# Patient Record
Sex: Female | Born: 1935 | Race: Asian | Hispanic: No | Smoking: Former smoker
Health system: Southern US, Community
[De-identification: ages and names within clinical notes are randomized; demographics above are authoritative.]

## PROBLEM LIST (undated history)

## (undated) DIAGNOSIS — E785 Hyperlipidemia, unspecified: Secondary | ICD-10-CM

## (undated) DIAGNOSIS — K317 Polyp of stomach and duodenum: Secondary | ICD-10-CM

## (undated) DIAGNOSIS — I10 Essential (primary) hypertension: Secondary | ICD-10-CM

## (undated) DIAGNOSIS — N289 Disorder of kidney and ureter, unspecified: Secondary | ICD-10-CM

## (undated) DIAGNOSIS — M1990 Unspecified osteoarthritis, unspecified site: Secondary | ICD-10-CM

## (undated) HISTORY — DX: Hyperlipidemia, unspecified: E78.5

## (undated) HISTORY — DX: Polyp of stomach and duodenum: K31.7

## (undated) HISTORY — DX: Essential (primary) hypertension: I10

---

## 1962-11-04 HISTORY — PX: APPENDECTOMY: SHX54

## 1965-11-04 HISTORY — PX: BACK SURGERY: SHX140

## 2002-10-22 ENCOUNTER — Emergency Department (HOSPITAL_COMMUNITY): Admission: EM | Admit: 2002-10-22 | Discharge: 2002-10-23 | Payer: Self-pay | Admitting: Emergency Medicine

## 2002-11-04 HISTORY — PX: BACK SURGERY: SHX140

## 2002-11-24 ENCOUNTER — Inpatient Hospital Stay (HOSPITAL_COMMUNITY): Admission: EM | Admit: 2002-11-24 | Discharge: 2002-12-14 | Payer: Self-pay | Admitting: Unknown Physician Specialty

## 2002-11-24 ENCOUNTER — Encounter: Payer: Self-pay | Admitting: Neurology

## 2002-11-25 ENCOUNTER — Encounter: Payer: Self-pay | Admitting: Neurology

## 2002-11-26 ENCOUNTER — Encounter: Payer: Self-pay | Admitting: Neurology

## 2002-11-29 ENCOUNTER — Encounter: Payer: Self-pay | Admitting: Neurology

## 2002-12-08 ENCOUNTER — Encounter: Payer: Self-pay | Admitting: Neurology

## 2002-12-14 ENCOUNTER — Inpatient Hospital Stay (HOSPITAL_COMMUNITY)
Admission: RE | Admit: 2002-12-14 | Discharge: 2002-12-24 | Payer: Self-pay | Admitting: Physical Medicine & Rehabilitation

## 2003-01-17 ENCOUNTER — Encounter: Admission: RE | Admit: 2003-01-17 | Discharge: 2003-01-17 | Payer: Self-pay | Admitting: Neurosurgery

## 2003-01-17 ENCOUNTER — Encounter: Payer: Self-pay | Admitting: Neurosurgery

## 2003-02-16 ENCOUNTER — Encounter: Admission: RE | Admit: 2003-02-16 | Discharge: 2003-02-16 | Payer: Self-pay | Admitting: Family Medicine

## 2003-02-16 ENCOUNTER — Encounter: Payer: Self-pay | Admitting: Family Medicine

## 2003-03-09 ENCOUNTER — Encounter
Admission: RE | Admit: 2003-03-09 | Discharge: 2003-06-07 | Payer: Self-pay | Admitting: Physical Medicine & Rehabilitation

## 2003-04-28 ENCOUNTER — Encounter: Payer: Self-pay | Admitting: Neurosurgery

## 2003-04-28 ENCOUNTER — Encounter: Admission: RE | Admit: 2003-04-28 | Discharge: 2003-04-28 | Payer: Self-pay | Admitting: Neurosurgery

## 2003-05-05 ENCOUNTER — Encounter: Admission: RE | Admit: 2003-05-05 | Discharge: 2003-06-24 | Payer: Self-pay | Admitting: Neurosurgery

## 2003-06-28 ENCOUNTER — Encounter: Admission: RE | Admit: 2003-06-28 | Discharge: 2003-09-26 | Payer: Self-pay | Admitting: Orthopedic Surgery

## 2003-07-05 ENCOUNTER — Encounter
Admission: RE | Admit: 2003-07-05 | Discharge: 2003-10-03 | Payer: Self-pay | Admitting: Physical Medicine & Rehabilitation

## 2003-07-28 ENCOUNTER — Encounter: Admission: RE | Admit: 2003-07-28 | Discharge: 2003-10-05 | Payer: Self-pay | Admitting: Family Medicine

## 2004-11-28 ENCOUNTER — Inpatient Hospital Stay (HOSPITAL_COMMUNITY): Admission: EM | Admit: 2004-11-28 | Discharge: 2004-12-01 | Payer: Self-pay | Admitting: Emergency Medicine

## 2004-12-10 ENCOUNTER — Ambulatory Visit: Payer: Self-pay | Admitting: Family Medicine

## 2004-12-26 ENCOUNTER — Ambulatory Visit: Payer: Self-pay | Admitting: Family Medicine

## 2005-01-30 ENCOUNTER — Ambulatory Visit: Payer: Self-pay | Admitting: Family Medicine

## 2005-03-04 ENCOUNTER — Ambulatory Visit: Payer: Self-pay | Admitting: Family Medicine

## 2005-03-18 ENCOUNTER — Ambulatory Visit: Payer: Self-pay | Admitting: Family Medicine

## 2005-04-11 ENCOUNTER — Encounter: Admission: RE | Admit: 2005-04-11 | Discharge: 2005-07-10 | Payer: Self-pay | Admitting: Family Medicine

## 2006-01-13 ENCOUNTER — Ambulatory Visit: Payer: Self-pay | Admitting: Family Medicine

## 2006-01-20 ENCOUNTER — Ambulatory Visit: Payer: Self-pay | Admitting: Family Medicine

## 2006-02-24 ENCOUNTER — Ambulatory Visit: Payer: Self-pay | Admitting: Family Medicine

## 2006-03-06 ENCOUNTER — Ambulatory Visit: Payer: Self-pay | Admitting: Family Medicine

## 2006-05-05 ENCOUNTER — Ambulatory Visit: Payer: Self-pay | Admitting: Family Medicine

## 2006-05-05 ENCOUNTER — Encounter: Payer: Self-pay | Admitting: Family Medicine

## 2006-05-05 ENCOUNTER — Other Ambulatory Visit: Admission: RE | Admit: 2006-05-05 | Discharge: 2006-05-05 | Payer: Self-pay | Admitting: Family Medicine

## 2006-05-29 ENCOUNTER — Ambulatory Visit: Payer: Self-pay | Admitting: Gastroenterology

## 2006-06-18 ENCOUNTER — Encounter: Admission: RE | Admit: 2006-06-18 | Discharge: 2006-06-18 | Payer: Self-pay | Admitting: Family Medicine

## 2006-08-04 ENCOUNTER — Ambulatory Visit: Payer: Self-pay | Admitting: Family Medicine

## 2006-08-07 ENCOUNTER — Ambulatory Visit: Payer: Self-pay | Admitting: Family Medicine

## 2006-08-14 ENCOUNTER — Ambulatory Visit: Payer: Self-pay | Admitting: Family Medicine

## 2006-08-14 LAB — CONVERTED CEMR LAB
Alkaline Phosphatase: 56 units/L (ref 39–117)
Calcium: 10 mg/dL (ref 8.4–10.5)
Chloride: 102 meq/L (ref 96–112)
Creatinine,U: 41.1 mg/dL
Hgb A1c MFr Bld: 6.9 % — ABNORMAL HIGH (ref 4.6–6.0)
Microalb Creat Ratio: 146 mg/g — ABNORMAL HIGH (ref 0.0–30.0)
Microalb, Ur: 6 mg/dL — ABNORMAL HIGH (ref 0.0–1.9)
Potassium: 4.3 meq/L (ref 3.5–5.1)

## 2006-09-17 ENCOUNTER — Ambulatory Visit: Payer: Self-pay | Admitting: Gastroenterology

## 2006-10-16 ENCOUNTER — Encounter (INDEPENDENT_AMBULATORY_CARE_PROVIDER_SITE_OTHER): Payer: Self-pay | Admitting: *Deleted

## 2006-10-16 ENCOUNTER — Ambulatory Visit: Payer: Self-pay | Admitting: Gastroenterology

## 2006-11-06 ENCOUNTER — Encounter: Admission: RE | Admit: 2006-11-06 | Discharge: 2006-11-06 | Payer: Self-pay | Admitting: Nephrology

## 2006-11-10 ENCOUNTER — Ambulatory Visit: Payer: Self-pay | Admitting: Family Medicine

## 2006-11-10 ENCOUNTER — Encounter: Admission: RE | Admit: 2006-11-10 | Discharge: 2006-11-10 | Payer: Self-pay | Admitting: Family Medicine

## 2006-11-19 ENCOUNTER — Ambulatory Visit: Payer: Self-pay | Admitting: Family Medicine

## 2006-11-19 LAB — CONVERTED CEMR LAB
ALT: 14 units/L (ref 0–40)
AST: 25 units/L (ref 0–37)
BUN: 16 mg/dL (ref 6–23)
Calcium: 9.6 mg/dL (ref 8.4–10.5)
Chloride: 103 meq/L (ref 96–112)
Creatinine,U: 26 mg/dL
GFR calc non Af Amer: 47 mL/min
Glucose, Bld: 124 mg/dL — ABNORMAL HIGH (ref 70–99)
Microalb, Ur: 3.2 mg/dL — ABNORMAL HIGH (ref 0.0–1.9)
Potassium: 4.5 meq/L (ref 3.5–5.1)
Sodium: 138 meq/L (ref 135–145)

## 2006-11-25 ENCOUNTER — Ambulatory Visit: Payer: Self-pay | Admitting: Gastroenterology

## 2006-12-03 ENCOUNTER — Ambulatory Visit: Payer: Self-pay | Admitting: Family Medicine

## 2007-02-25 ENCOUNTER — Ambulatory Visit: Payer: Self-pay | Admitting: Family Medicine

## 2007-02-25 LAB — CONVERTED CEMR LAB
AST: 26 units/L (ref 0–37)
Alkaline Phosphatase: 55 units/L (ref 39–117)
Bilirubin, Direct: 0.1 mg/dL (ref 0.0–0.3)
Creatinine, Ser: 1.4 mg/dL — ABNORMAL HIGH (ref 0.4–1.2)
GFR calc Af Amer: 48 mL/min
GFR calc non Af Amer: 40 mL/min
Glucose, Bld: 123 mg/dL — ABNORMAL HIGH (ref 70–99)
Microalb Creat Ratio: 148.7 mg/g — ABNORMAL HIGH (ref 0.0–30.0)
Microalb, Ur: 4.7 mg/dL — ABNORMAL HIGH (ref 0.0–1.9)
Potassium: 4.3 meq/L (ref 3.5–5.1)
Total Protein: 7.1 g/dL (ref 6.0–8.3)

## 2007-03-11 ENCOUNTER — Ambulatory Visit: Payer: Self-pay | Admitting: Family Medicine

## 2007-03-19 DIAGNOSIS — Z860100 Personal history of colon polyps, unspecified: Secondary | ICD-10-CM | POA: Insufficient documentation

## 2007-03-19 DIAGNOSIS — J45909 Unspecified asthma, uncomplicated: Secondary | ICD-10-CM | POA: Insufficient documentation

## 2007-03-19 DIAGNOSIS — K922 Gastrointestinal hemorrhage, unspecified: Secondary | ICD-10-CM | POA: Insufficient documentation

## 2007-03-19 DIAGNOSIS — I10 Essential (primary) hypertension: Secondary | ICD-10-CM | POA: Insufficient documentation

## 2007-03-19 DIAGNOSIS — Z8739 Personal history of other diseases of the musculoskeletal system and connective tissue: Secondary | ICD-10-CM

## 2007-03-19 DIAGNOSIS — E1142 Type 2 diabetes mellitus with diabetic polyneuropathy: Secondary | ICD-10-CM | POA: Insufficient documentation

## 2007-06-10 ENCOUNTER — Ambulatory Visit: Payer: Self-pay | Admitting: Family Medicine

## 2007-06-16 ENCOUNTER — Encounter: Payer: Self-pay | Admitting: Family Medicine

## 2007-06-17 ENCOUNTER — Encounter: Payer: Self-pay | Admitting: Family Medicine

## 2007-06-19 ENCOUNTER — Telehealth (INDEPENDENT_AMBULATORY_CARE_PROVIDER_SITE_OTHER): Payer: Self-pay | Admitting: *Deleted

## 2007-06-24 ENCOUNTER — Ambulatory Visit: Payer: Self-pay | Admitting: Family Medicine

## 2007-06-24 DIAGNOSIS — J301 Allergic rhinitis due to pollen: Secondary | ICD-10-CM

## 2007-06-24 DIAGNOSIS — E785 Hyperlipidemia, unspecified: Secondary | ICD-10-CM | POA: Insufficient documentation

## 2007-06-25 ENCOUNTER — Encounter: Payer: Self-pay | Admitting: Family Medicine

## 2007-07-07 ENCOUNTER — Telehealth (INDEPENDENT_AMBULATORY_CARE_PROVIDER_SITE_OTHER): Payer: Self-pay | Admitting: *Deleted

## 2007-07-21 ENCOUNTER — Encounter: Payer: Self-pay | Admitting: Family Medicine

## 2007-08-09 ENCOUNTER — Inpatient Hospital Stay (HOSPITAL_COMMUNITY): Admission: EM | Admit: 2007-08-09 | Discharge: 2007-08-12 | Payer: Self-pay | Admitting: Emergency Medicine

## 2007-08-09 ENCOUNTER — Encounter: Payer: Self-pay | Admitting: Family Medicine

## 2007-08-10 ENCOUNTER — Ambulatory Visit: Payer: Self-pay | Admitting: Internal Medicine

## 2007-08-17 ENCOUNTER — Ambulatory Visit: Payer: Self-pay | Admitting: Internal Medicine

## 2007-08-18 ENCOUNTER — Encounter: Payer: Self-pay | Admitting: Family Medicine

## 2007-08-20 ENCOUNTER — Encounter: Payer: Self-pay | Admitting: Family Medicine

## 2007-09-07 ENCOUNTER — Telehealth (INDEPENDENT_AMBULATORY_CARE_PROVIDER_SITE_OTHER): Payer: Self-pay | Admitting: *Deleted

## 2007-09-11 ENCOUNTER — Ambulatory Visit: Payer: Self-pay | Admitting: Family Medicine

## 2007-09-11 DIAGNOSIS — M810 Age-related osteoporosis without current pathological fracture: Secondary | ICD-10-CM | POA: Insufficient documentation

## 2007-09-11 LAB — CONVERTED CEMR LAB: Hemoglobin: 13.2 g/dL

## 2007-09-15 ENCOUNTER — Telehealth (INDEPENDENT_AMBULATORY_CARE_PROVIDER_SITE_OTHER): Payer: Self-pay | Admitting: *Deleted

## 2007-09-15 ENCOUNTER — Encounter: Payer: Self-pay | Admitting: Family Medicine

## 2007-09-16 ENCOUNTER — Telehealth: Payer: Self-pay | Admitting: Family Medicine

## 2007-09-24 ENCOUNTER — Telehealth: Payer: Self-pay | Admitting: Family Medicine

## 2007-09-25 ENCOUNTER — Ambulatory Visit: Payer: Self-pay | Admitting: Family Medicine

## 2007-09-28 ENCOUNTER — Encounter (INDEPENDENT_AMBULATORY_CARE_PROVIDER_SITE_OTHER): Payer: Self-pay | Admitting: *Deleted

## 2007-10-06 ENCOUNTER — Telehealth (INDEPENDENT_AMBULATORY_CARE_PROVIDER_SITE_OTHER): Payer: Self-pay | Admitting: *Deleted

## 2007-10-09 ENCOUNTER — Ambulatory Visit: Payer: Self-pay | Admitting: Family Medicine

## 2007-10-10 ENCOUNTER — Encounter: Payer: Self-pay | Admitting: Family Medicine

## 2007-10-13 ENCOUNTER — Telehealth (INDEPENDENT_AMBULATORY_CARE_PROVIDER_SITE_OTHER): Payer: Self-pay | Admitting: *Deleted

## 2007-10-14 ENCOUNTER — Encounter: Payer: Self-pay | Admitting: Family Medicine

## 2007-10-15 ENCOUNTER — Telehealth (INDEPENDENT_AMBULATORY_CARE_PROVIDER_SITE_OTHER): Payer: Self-pay | Admitting: *Deleted

## 2007-10-22 ENCOUNTER — Ambulatory Visit: Payer: Self-pay | Admitting: Cardiology

## 2007-11-11 ENCOUNTER — Telehealth (INDEPENDENT_AMBULATORY_CARE_PROVIDER_SITE_OTHER): Payer: Self-pay | Admitting: *Deleted

## 2007-12-07 ENCOUNTER — Telehealth (INDEPENDENT_AMBULATORY_CARE_PROVIDER_SITE_OTHER): Payer: Self-pay | Admitting: *Deleted

## 2007-12-09 ENCOUNTER — Telehealth (INDEPENDENT_AMBULATORY_CARE_PROVIDER_SITE_OTHER): Payer: Self-pay | Admitting: *Deleted

## 2007-12-17 ENCOUNTER — Ambulatory Visit: Payer: Self-pay | Admitting: Family Medicine

## 2007-12-21 ENCOUNTER — Ambulatory Visit: Payer: Self-pay | Admitting: Cardiovascular Disease

## 2007-12-22 ENCOUNTER — Telehealth (INDEPENDENT_AMBULATORY_CARE_PROVIDER_SITE_OTHER): Payer: Self-pay | Admitting: *Deleted

## 2008-01-06 ENCOUNTER — Telehealth: Payer: Self-pay | Admitting: Family Medicine

## 2008-01-06 LAB — CONVERTED CEMR LAB
ALT: 14 units/L (ref 0–35)
Bilirubin, Direct: 0.1 mg/dL (ref 0.0–0.3)
HDL: 47 mg/dL (ref >39.0)
Total Bilirubin: 0.7 mg/dL (ref 0.3–1.2)
Total CHOL/HDL Ratio: 5.4
Total Protein: 7.2 g/dL (ref 6.0–8.3)
Triglycerides: 162 mg/dL — ABNORMAL HIGH (ref 0–149)

## 2008-01-08 ENCOUNTER — Encounter: Payer: Self-pay | Admitting: Family Medicine

## 2008-01-13 ENCOUNTER — Encounter: Payer: Self-pay | Admitting: Family Medicine

## 2008-02-04 ENCOUNTER — Telehealth (INDEPENDENT_AMBULATORY_CARE_PROVIDER_SITE_OTHER): Payer: Self-pay | Admitting: *Deleted

## 2008-02-29 ENCOUNTER — Ambulatory Visit: Payer: Self-pay | Admitting: Family Medicine

## 2008-03-02 LAB — CONVERTED CEMR LAB: Alkaline Phosphatase: 45 units/L (ref 39–117)

## 2008-03-03 ENCOUNTER — Encounter (INDEPENDENT_AMBULATORY_CARE_PROVIDER_SITE_OTHER): Payer: Self-pay | Admitting: *Deleted

## 2008-03-03 ENCOUNTER — Telehealth (INDEPENDENT_AMBULATORY_CARE_PROVIDER_SITE_OTHER): Payer: Self-pay | Admitting: *Deleted

## 2008-03-04 ENCOUNTER — Telehealth (INDEPENDENT_AMBULATORY_CARE_PROVIDER_SITE_OTHER): Payer: Self-pay | Admitting: *Deleted

## 2008-03-07 ENCOUNTER — Ambulatory Visit: Payer: Self-pay | Admitting: Internal Medicine

## 2008-03-07 LAB — CONVERTED CEMR LAB
Cholesterol: 320 mg/dL (ref 0–200)
Total CHOL/HDL Ratio: 8.4
VLDL: 54 mg/dL — ABNORMAL HIGH (ref 0–40)

## 2008-06-09 ENCOUNTER — Telehealth (INDEPENDENT_AMBULATORY_CARE_PROVIDER_SITE_OTHER): Payer: Self-pay | Admitting: *Deleted

## 2008-06-16 ENCOUNTER — Ambulatory Visit: Payer: Self-pay | Admitting: Family Medicine

## 2008-06-20 ENCOUNTER — Ambulatory Visit: Payer: Self-pay | Admitting: Cardiology

## 2008-06-23 ENCOUNTER — Encounter: Payer: Self-pay | Admitting: Family Medicine

## 2008-06-23 ENCOUNTER — Telehealth (INDEPENDENT_AMBULATORY_CARE_PROVIDER_SITE_OTHER): Payer: Self-pay | Admitting: *Deleted

## 2008-06-23 LAB — CONVERTED CEMR LAB
ALT: 17 units/L (ref 0–35)
Albumin: 4 g/dL (ref 3.5–5.2)
Alkaline Phosphatase: 37 units/L — ABNORMAL LOW (ref 39–117)
Direct LDL: 159.6 mg/dL
Total Protein: 7.8 g/dL (ref 6.0–8.3)
Triglycerides: 223 mg/dL (ref 0–149)

## 2008-07-08 ENCOUNTER — Telehealth (INDEPENDENT_AMBULATORY_CARE_PROVIDER_SITE_OTHER): Payer: Self-pay | Admitting: *Deleted

## 2008-09-14 ENCOUNTER — Ambulatory Visit: Payer: Self-pay | Admitting: Family Medicine

## 2008-09-19 ENCOUNTER — Ambulatory Visit: Payer: Self-pay | Admitting: Cardiology

## 2008-10-05 ENCOUNTER — Telehealth (INDEPENDENT_AMBULATORY_CARE_PROVIDER_SITE_OTHER): Payer: Self-pay | Admitting: *Deleted

## 2008-10-06 LAB — CONVERTED CEMR LAB
ALT: 16 units/L (ref 0–35)
AST: 27 units/L (ref 0–37)
Albumin: 3.7 g/dL (ref 3.5–5.2)
Direct LDL: 169.9 mg/dL
Total CHOL/HDL Ratio: 7
Total Protein: 7.3 g/dL (ref 6.0–8.3)
VLDL: 38 mg/dL (ref 0–40)

## 2008-12-06 ENCOUNTER — Telehealth (INDEPENDENT_AMBULATORY_CARE_PROVIDER_SITE_OTHER): Payer: Self-pay | Admitting: *Deleted

## 2009-01-05 ENCOUNTER — Telehealth (INDEPENDENT_AMBULATORY_CARE_PROVIDER_SITE_OTHER): Payer: Self-pay | Admitting: *Deleted

## 2009-01-11 ENCOUNTER — Ambulatory Visit: Payer: Self-pay | Admitting: Internal Medicine

## 2009-01-11 LAB — CONVERTED CEMR LAB
AST: 28 units/L (ref 0–37)
Alkaline Phosphatase: 42 units/L (ref 39–117)
Bilirubin, Direct: 0.1 mg/dL (ref 0.0–0.3)
Total CHOL/HDL Ratio: 4.6
Total Protein: 7.7 g/dL (ref 6.0–8.3)
Triglycerides: 191 mg/dL — ABNORMAL HIGH (ref 0–149)
VLDL: 38 mg/dL (ref 0–40)

## 2009-01-18 ENCOUNTER — Ambulatory Visit: Payer: Self-pay | Admitting: Family Medicine

## 2009-01-20 ENCOUNTER — Encounter (INDEPENDENT_AMBULATORY_CARE_PROVIDER_SITE_OTHER): Payer: Self-pay | Admitting: *Deleted

## 2009-01-20 LAB — CONVERTED CEMR LAB: Vit D, 25-Hydroxy: 37 ng/mL (ref 30–89)

## 2009-01-23 ENCOUNTER — Encounter: Admission: RE | Admit: 2009-01-23 | Discharge: 2009-01-23 | Payer: Self-pay | Admitting: Family Medicine

## 2009-01-23 ENCOUNTER — Encounter: Payer: Self-pay | Admitting: Family Medicine

## 2009-01-30 ENCOUNTER — Ambulatory Visit: Payer: Self-pay | Admitting: Cardiology

## 2009-01-30 ENCOUNTER — Encounter (INDEPENDENT_AMBULATORY_CARE_PROVIDER_SITE_OTHER): Payer: Self-pay | Admitting: *Deleted

## 2009-01-30 ENCOUNTER — Encounter: Payer: Self-pay | Admitting: Cardiology

## 2009-02-08 ENCOUNTER — Encounter: Payer: Self-pay | Admitting: Family Medicine

## 2009-02-10 ENCOUNTER — Encounter (INDEPENDENT_AMBULATORY_CARE_PROVIDER_SITE_OTHER): Payer: Self-pay | Admitting: *Deleted

## 2009-02-10 LAB — CONVERTED CEMR LAB
ALT: 22 units/L (ref 0–35)
AST: 26 units/L (ref 0–37)
BUN: 27 mg/dL — ABNORMAL HIGH (ref 6–23)
Basophils Absolute: 0 10*3/uL (ref 0.0–0.1)
CO2: 32 meq/L (ref 19–32)
Calcium: 9.8 mg/dL (ref 8.4–10.5)
Eosinophils Absolute: 0.2 10*3/uL (ref 0.0–0.7)
GFR calc non Af Amer: 42.89 mL/min (ref >60)
Glucose, Bld: 123 mg/dL — ABNORMAL HIGH (ref 70–99)
HCT: 36.5 % (ref 36.0–46.0)
Hgb A1c MFr Bld: 6.7 % — ABNORMAL HIGH (ref 4.6–6.5)
Lymphocytes Relative: 25.4 % (ref 12.0–46.0)
Lymphs Abs: 2.1 10*3/uL (ref 0.7–4.0)
MCHC: 34.7 g/dL (ref 30.0–36.0)
Microalb Creat Ratio: 266.3 mg/g — ABNORMAL HIGH (ref 0.0–30.0)
Neutro Abs: 5.5 10*3/uL (ref 1.4–7.7)
Neutrophils Relative %: 65.3 % (ref 43.0–77.0)
Potassium: 4.3 meq/L (ref 3.5–5.1)
RDW: 13.1 % (ref 11.5–14.6)
Total Bilirubin: 0.9 mg/dL (ref 0.3–1.2)

## 2009-02-17 ENCOUNTER — Telehealth (INDEPENDENT_AMBULATORY_CARE_PROVIDER_SITE_OTHER): Payer: Self-pay | Admitting: *Deleted

## 2009-04-24 ENCOUNTER — Telehealth (INDEPENDENT_AMBULATORY_CARE_PROVIDER_SITE_OTHER): Payer: Self-pay | Admitting: *Deleted

## 2009-05-03 ENCOUNTER — Encounter: Payer: Self-pay | Admitting: Cardiology

## 2009-05-10 ENCOUNTER — Ambulatory Visit: Payer: Self-pay | Admitting: Family Medicine

## 2009-05-17 ENCOUNTER — Ambulatory Visit: Payer: Self-pay | Admitting: Internal Medicine

## 2009-05-22 LAB — CONVERTED CEMR LAB
Alkaline Phosphatase: 50 units/L (ref 39–117)
Bilirubin, Direct: 0.2 mg/dL (ref 0.0–0.3)
Cholesterol: 233 mg/dL — ABNORMAL HIGH (ref 0–200)
HDL: 50.9 mg/dL (ref >39.00)
VLDL: 41.2 mg/dL — ABNORMAL HIGH (ref 0.0–40.0)

## 2009-05-25 ENCOUNTER — Ambulatory Visit: Payer: Self-pay | Admitting: Family Medicine

## 2009-06-04 LAB — CONVERTED CEMR LAB
Albumin: 3.9 g/dL (ref 3.5–5.2)
BUN: 23 mg/dL (ref 6–23)
Basophils Absolute: 0.1 10*3/uL (ref 0.0–0.1)
Basophils Relative: 0.8 % (ref 0.0–3.0)
CO2: 30 meq/L (ref 19–32)
Creatinine, Ser: 1.4 mg/dL — ABNORMAL HIGH (ref 0.4–1.2)
Direct LDL: 138 mg/dL
Eosinophils Absolute: 0.2 10*3/uL (ref 0.0–0.7)
GFR calc non Af Amer: 39.33 mL/min (ref >60)
HDL: 43.3 mg/dL (ref >39.00)
Hemoglobin: 12.4 g/dL (ref 12.0–15.0)
Hgb A1c MFr Bld: 6.6 % — ABNORMAL HIGH (ref 4.6–6.5)
Lymphocytes Relative: 25.6 % (ref 12.0–46.0)
Lymphs Abs: 2 10*3/uL (ref 0.7–4.0)
Microalb Creat Ratio: 231.6 mg/g — ABNORMAL HIGH (ref 0.0–30.0)
Microalb, Ur: 21.1 mg/dL — ABNORMAL HIGH (ref 0.0–1.9)
Monocytes Relative: 8.9 % (ref 3.0–12.0)
Platelets: 256 10*3/uL (ref 150.0–400.0)
RBC: 3.93 M/uL (ref 3.87–5.11)
Total Protein: 7.5 g/dL (ref 6.0–8.3)

## 2009-06-06 ENCOUNTER — Encounter (INDEPENDENT_AMBULATORY_CARE_PROVIDER_SITE_OTHER): Payer: Self-pay | Admitting: *Deleted

## 2009-06-07 ENCOUNTER — Ambulatory Visit: Payer: Self-pay | Admitting: Family Medicine

## 2009-06-07 DIAGNOSIS — H269 Unspecified cataract: Secondary | ICD-10-CM

## 2009-06-07 DIAGNOSIS — K589 Irritable bowel syndrome without diarrhea: Secondary | ICD-10-CM

## 2009-06-07 DIAGNOSIS — R9431 Abnormal electrocardiogram [ECG] [EKG]: Secondary | ICD-10-CM

## 2009-06-28 ENCOUNTER — Encounter: Payer: Self-pay | Admitting: Family Medicine

## 2009-07-07 ENCOUNTER — Ambulatory Visit: Payer: Self-pay | Admitting: Cardiology

## 2009-07-17 ENCOUNTER — Ambulatory Visit: Payer: Self-pay | Admitting: Gastroenterology

## 2009-07-17 DIAGNOSIS — R197 Diarrhea, unspecified: Secondary | ICD-10-CM

## 2009-07-21 ENCOUNTER — Encounter: Payer: Self-pay | Admitting: Gastroenterology

## 2009-07-21 ENCOUNTER — Telehealth: Payer: Self-pay | Admitting: Gastroenterology

## 2009-08-09 ENCOUNTER — Ambulatory Visit: Payer: Self-pay | Admitting: Family Medicine

## 2009-08-15 LAB — CONVERTED CEMR LAB
Alkaline Phosphatase: 39 units/L (ref 39–117)
Cholesterol: 228 mg/dL — ABNORMAL HIGH (ref 0–200)
Direct LDL: 139.2 mg/dL
HDL: 42.7 mg/dL (ref >39.00)
Total Protein: 7.6 g/dL (ref 6.0–8.3)

## 2009-08-17 ENCOUNTER — Ambulatory Visit: Payer: Self-pay | Admitting: Cardiology

## 2009-11-22 ENCOUNTER — Ambulatory Visit: Payer: Self-pay | Admitting: Family Medicine

## 2009-11-27 ENCOUNTER — Ambulatory Visit: Payer: Self-pay | Admitting: Cardiology

## 2009-12-02 LAB — CONVERTED CEMR LAB
AST: 22 units/L (ref 0–37)
Alkaline Phosphatase: 45 units/L (ref 39–117)
Direct LDL: 133.7 mg/dL
HDL: 50.9 mg/dL (ref >39.00)
Total Bilirubin: 1 mg/dL (ref 0.3–1.2)
Total CHOL/HDL Ratio: 4
VLDL: 44.6 mg/dL — ABNORMAL HIGH (ref 0.0–40.0)

## 2009-12-15 ENCOUNTER — Ambulatory Visit: Payer: Self-pay | Admitting: Family Medicine

## 2009-12-19 ENCOUNTER — Telehealth (INDEPENDENT_AMBULATORY_CARE_PROVIDER_SITE_OTHER): Payer: Self-pay | Admitting: *Deleted

## 2009-12-19 LAB — CONVERTED CEMR LAB
BUN: 22 mg/dL (ref 6–23)
Calcium: 10 mg/dL (ref 8.4–10.5)
GFR calc non Af Amer: 46.92 mL/min (ref >60)
Glucose, Bld: 102 mg/dL — ABNORMAL HIGH (ref 70–99)
Hgb A1c MFr Bld: 7 % — ABNORMAL HIGH (ref 4.6–6.5)
Potassium: 4.5 meq/L (ref 3.5–5.1)

## 2009-12-26 ENCOUNTER — Encounter: Payer: Self-pay | Admitting: Family Medicine

## 2009-12-27 ENCOUNTER — Ambulatory Visit: Payer: Self-pay | Admitting: Family Medicine

## 2009-12-28 ENCOUNTER — Encounter: Payer: Self-pay | Admitting: Family Medicine

## 2010-03-08 ENCOUNTER — Telehealth (INDEPENDENT_AMBULATORY_CARE_PROVIDER_SITE_OTHER): Payer: Self-pay | Admitting: *Deleted

## 2010-06-04 ENCOUNTER — Encounter: Payer: Self-pay | Admitting: Family Medicine

## 2010-07-30 ENCOUNTER — Telehealth: Payer: Self-pay | Admitting: Family Medicine

## 2010-08-30 ENCOUNTER — Telehealth: Payer: Self-pay | Admitting: Family Medicine

## 2010-09-26 ENCOUNTER — Telehealth (INDEPENDENT_AMBULATORY_CARE_PROVIDER_SITE_OTHER): Payer: Self-pay | Admitting: *Deleted

## 2010-12-02 LAB — CONVERTED CEMR LAB
ALT: 16 units/L (ref 0–35)
AST: 23 units/L (ref 0–37)
Albumin: 3.5 g/dL (ref 3.5–5.2)
BUN: 33 mg/dL — ABNORMAL HIGH (ref 6–23)
Bilirubin, Direct: 0.1 mg/dL (ref 0.0–0.3)
CO2: 29 meq/L (ref 19–32)
Calcium: 10 mg/dL (ref 8.4–10.5)
Creatinine, Ser: 1.5 mg/dL — ABNORMAL HIGH (ref 0.4–1.2)
Creatinine,U: 65.7 mg/dL
GFR calc Af Amer: 44 mL/min
GFR calc non Af Amer: 37 mL/min
Glucose, Bld: 101 mg/dL — ABNORMAL HIGH (ref 70–99)
Hemoglobin: 12.8 g/dL (ref 12.0–15.0)
Hgb A1c MFr Bld: 5.6 % (ref 4.6–6.0)
Hgb A1c MFr Bld: 5.9 % (ref 4.6–6.0)
MCHC: 35.2 g/dL (ref 30.0–36.0)
MCV: 89.7 fL (ref 78.0–100.0)
Microalb, Ur: 3.8 mg/dL — ABNORMAL HIGH (ref 0.0–1.9)
Neutrophils Relative %: 60.5 % (ref 43.0–77.0)
RBC: 4.05 M/uL (ref 3.87–5.11)
RDW: 13.8 % (ref 11.5–14.6)
Sodium: 139 meq/L (ref 135–145)

## 2010-12-04 NOTE — Progress Notes (Signed)
Summary: Swollen Fingers  Phone Note Call from Patient Call back at Work Phone [phone]   Caller: Patient Call For: Loreen Freud DO Summary of Call: Patient Index and Middle finger on right hand is swollen.  Patient has tried Ibuprofen and cold packets with little improvement.  Please call patient to let her know what else she can try at home. Initial call taken by: Barnie Mort,  Mar 08, 2010 3:58 PM  Follow-up for Phone Call        elevation, ice and ibuprofen is about all she can do at home.  Any pain? ---- Ov here or if pain go to ER Follow-up by: Loreen Freud DO,  Mar 08, 2010 4:12 PM  Additional Follow-up for Phone Call Additional follow up Details #1::        pt informed, she does not have pain now; she is taking Ibuprofen.  pt given Dr Laury Axon recommendations, offered appt pt wants to old off for now. No insurance .Kandice Hams  Mar 08, 2010 4:36 PM  Additional Follow-up by: Kandice Hams,  Mar 08, 2010 4:36 PM

## 2010-12-04 NOTE — Progress Notes (Signed)
Summary: Rx Request  Phone Note Refill Request Message from:  Fax from Pharmacy on July 30, 2010 10:15 AM  Refills Requested: Medication #1:  clotrimazole/betameth cream Rcv'd fax from pharmacy, Rx never filled here. Please advise  Initial call taken by: Almeta Monas CMA Duncan Dull),  July 30, 2010 10:15 AM  Follow-up for Phone Call        refill x1  2 refills Follow-up by: Loreen Freud DO,  July 30, 2010 10:44 AM    New/Updated Medications: CLOTRIMAZOLE-BETAMETHASONE 1-0.05 % CREA (CLOTRIMAZOLE-BETAMETHASONE) Apply as directed two times a day Prescriptions: CLOTRIMAZOLE-BETAMETHASONE 1-0.05 % CREA (CLOTRIMAZOLE-BETAMETHASONE) Apply as directed two times a day  #1 tube x 2   Entered by:   Jeremy Johann CMA   Authorized by:   Loreen Freud DO   Signed by:   Jeremy Johann CMA on 07/30/2010   Method used:   Faxed to ...       [facility]* (retail)       [address]       [place], [place]  [id number]       Ph: [phone]       Fax: [phone]   RxID:   [id number]

## 2010-12-04 NOTE — Medication Information (Signed)
Summary: Diabetes Supplies[facility]  Diabetes Supplies[facility]   Imported By: Lanelle Bal 06/11/2010 12:57:47  _____________________________________________________________________  External Attachment:    Type:   Image     Comment:   External Document

## 2010-12-04 NOTE — Progress Notes (Signed)
Summary: refill  Phone Note Refill Request Message from:  Fax from Pharmacy on August 30, 2010 8:32 AM  Refills Requested: Medication #1:  BENICAR 20 MG  TABS 1 by mouth once daily [place] - fax [phone]  Initial call taken by: Okey Regal [place],  August 30, 2010 8:33 AM    Prescriptions: BENICAR 20 MG  TABS (OLMESARTAN MEDOXOMIL) 1 by mouth once daily  #30 Each x 1   Entered by:   Almeta Monas CMA (AAMA)   Authorized by:   Loreen Freud DO   Signed by:   Almeta Monas CMA (AAMA) on 08/30/2010   Method used:   Electronically to        [facility]* (retail)       [address]       [place], [place]  [id number]       Ph: [phone]       Fax: [phone]   RxID:   [id number]

## 2010-12-04 NOTE — Letter (Signed)
Summary: CMN for Diabetes Supplies/[facility]  CMN for Diabetes Supplies[facility]   Imported By: Lanelle Bal 01/01/2010 09:15:07  _____________________________________________________________________  External Attachment:    Type:   Image     Comment:   External Document

## 2010-12-04 NOTE — Progress Notes (Signed)
Summary: Refill Request  Phone Note Refill Request Call back at [phone] Message from:  Pharmacy on September 26, 2010 3:47 PM  Refills Requested: Medication #1:  TRAMADOL HCL 50 MG TABS 1 by mouth three times a day as needed   Dosage confirmed as above?Dosage Confirmed   Supply Requested: 1 month   Last Refilled: 07/26/2010 [facility]  Next Appointment Scheduled: none Initial call taken by: Harold Barban,  September 26, 2010 3:47 PM  Follow-up for Phone Call        last seen and filled 12/27/09.Marland KitchenMarland Kitchenplease advise Follow-up by: Almeta Monas CMA Duncan Dull),  September 26, 2010 3:52 PM

## 2010-12-04 NOTE — Progress Notes (Signed)
Summary: left msg for pt to call re; labs  Phone Note Outgoing Call   Call placed by: Kandice Hams,  December 19, 2009 11:29 AM Call placed to: Patient Summary of Call: left msg for  pt to call re; labs Initial call taken by: Kandice Hams,  December 19, 2009 11:29 AM  Follow-up for Phone Call        Your hgba1c should be <_7____.  Watch your simple sugars (cakes, cookies) and starches, white flour etc.   We will recheck fasting labs in ___3_ months.  Diet and exercise are important for this.  ---Pt is on glyburide for DM---  has she tried glucophage?  I don't see it on med list---if no--glucophage xr 500 1 by mouth qpm #30  2 refills.      hgba1c, bmp 250.00 Signed by Loreen Freud DO on 12/17/2009 at 4:10 PM Follow-up by: Army Fossa CMA,  December 19, 2009 11:45 AM  Additional Follow-up for Phone Call Additional follow up Details #1::        LMTCB. Army Fossa CMA  December 20, 2009 2:09 PM     Additional Follow-up for Phone Call Additional follow up Details #2::    LMTCB. Army Fossa CMA  December 26, 2009 11:42 AM   Additional Follow-up for Phone Call Additional follow up Details #3:: Details for Additional Follow-up Action Taken: Pt has not tried Glucophage before- she has an appt with Dr. Laury Axon tomorrow a.m. she would like for me to wait on sending in any medications until she is able to discuss this with Dr.Lowne. Army Fossa CMA  December 26, 2009 3:32 PM

## 2010-12-04 NOTE — Assessment & Plan Note (Signed)
Summary: RTO 6 MONTHS.CBS   Vital Signs:  Patient profile:   76 year old female Weight:      152 pounds Temp:     97.8 degrees F oral Pulse rate:   72 / minute Pulse rhythm:   regular BP sitting:   122 / 80  (left arm) Cuff size:   regular  Vitals Entered By: Army Fossa CMA (December 27, 2009 11:07 AM) CC: Pt here for 6 month follow up- states her right hand has been going numb. Discuss meds.    History of Present Illness:  Hypertension follow-up      This is a 76 year old woman who presents for Hypertension follow-up.  The patient denies lightheadedness, urinary frequency, headaches, edema, impotence, rash, and fatigue.  The patient denies the following associated symptoms: chest pain, chest pressure, exercise intolerance, dyspnea, palpitations, syncope, leg edema, and pedal edema.  Compliance with medications (by patient report) has been near 100%.  The patient reports that dietary compliance has been good.  The patient reports no exercise.  Adjunctive measures currently used by the patient include salt restriction.    Type 1 diabetes mellitus follow-up      The patient is also here for Type 2 diabetes mellitus follow-up.  The patient denies polyuria, polydipsia, blurred vision, self managed hypoglycemia, hypoglycemia requiring help, weight loss, weight gain, and numbness of extremities.  The patient denies the following symptoms: neuropathic pain, chest pain, vomiting, orthostatic symptoms, poor wound healing, intermittent claudication, vision loss, and foot ulcer.  Since the last visit the patient reports good dietary compliance, compliance with medications, and monitoring blood glucose.  Since the last visit, the patient reports having had eye care by an ophthalmologist.    Current Medications (verified): 1)  Tramadol Hcl 50 Mg Tabs (Tramadol Hcl) .Marland Kitchen.. 1 By Mouth Three Times A Day As Needed 2)  Benicar 20 Mg  Tabs (Olmesartan Medoxomil) .Marland Kitchen.. 1 By Mouth Once Daily 3)  Atenolol  100 Mg Tabs (Atenolol) .Marland Kitchen.. 1 By Mouth Once Daily 4)  Indapamide 1.25 Mg  Tabs (Indapamide) .Marland Kitchen.. 1 By Mouth Once Daily 5)  Glimepiride 2 Mg  Tabs (Glimepiride) .... Take One Tablet Daily 6)  Advair Diskus 250-50 Mcg/dose  Misc (Fluticasone-Salmeterol) .... 2 Puff Once Daily 7)  Astelin 137 Mcg/spray  Soln (Azelastine Hcl) .... 2 Sprays Each Nostril Two Times A Day 8)  Multivitamins   Tabs (Multiple Vitamin) .... Take 1 Tablet By Mouth Daily 9)  Ascorbic Acid 500 Mg Tabs (Ascorbic Acid) .Marland Kitchen.. 1 By Mouth Daily. 10)  Vitamin E 400 Unit Caps (Vitamin E) .Marland Kitchen.. 1 By Mouth Daily. 11)  Calcium 600 1500 Mg Tabs (Calcium Carbonate) .Marland Kitchen.. 1 By Mouth Daily. 12)  Fish Oil .... Take By Mouth As Directed 13)  Flax   Oil (Flaxseed (Linseed)) .... Take By Mouth As Directed 14)  Fenofibrate 54 Mg Tabs (Fenofibrate) .Marland Kitchen.. 1 By Mouth Daily 15)  Proair Hfa 108 (90 Base) Mcg/act Aers (Albuterol Sulfate) .... 2 Puffs Qid As Needed 16)  Vitamin D 1000 Unit Tabs (Cholecalciferol) .... Once Daily 17)  Hyomax-Sl 0.125 Mg Subl (Hyoscyamine Sulfate) .... Take 2 Tabs Sublingual Before Each Meal If The Vegetables Are Eaten 18)  Vytorin 10-20 Mg Tabs (Ezetimibe-Simvastatin) .... Take One Tablet By Mouth Every Other Day At Bedtime  Allergies: 1)  ! Pcn 2)  ! Codeine 3)  ! Ace Inhibitors  Past History:  Past medical, surgical, family and social histories (including risk factors) reviewed for relevance to  current acute and chronic problems.  Past Medical History: Reviewed history from 07/14/2009 and no changes required. Asthma Hypertension x 7 years Diabetes mellitus, type II x 5 years Breast cancer, hx of Hyperlipidemia x 15 years Osteoporosis hyperplastic polyps  2007  Past Surgical History: Reviewed history from 07/14/2009 and no changes required. Caesarean section 2  Appendectomy 1964  Family History: Reviewed history from 11/27/2009 and no changes required. Family History Hypertension No early heart  disease (father with CABG in late 53s) No FH of Colon Cancer:  Social History: Reviewed history from 07/07/2009 and no changes required. Former Smoker 1 1/2 ppd for 40 years. Quit 11 years ago Alcohol use-no Drug use-no Regular exercise-yes Divorced  Review of Systems      See HPI  Physical Exam  General:  Well-developed,well-nourished,in no acute distress; alert,appropriate and cooperative throughout examination Lungs:  Normal respiratory effort, chest expands symmetrically. Lungs are clear to auscultation, no crackles or wheezes. Heart:  normal rate and no murmur.    Diabetes Management Exam:    Foot Exam (with socks and/or shoes not present):       Sensory-Pinprick/Light touch:          Left medial foot (L-4): normal          Left dorsal foot (L-5): normal          Left lateral foot (S-1): normal          Right medial foot (L-4): normal          Right dorsal foot (L-5): normal          Right lateral foot (S-1): normal       Sensory-Monofilament:          Left foot: normal          Right foot: normal       Inspection:          Left foot: normal          Right foot: normal       Nails:          Left foot: normal          Right foot: normal    Eye Exam:       Eye Exam done elsewhere          Results: normal   Impression & Recommendations:  Problem # 1:  DIABETES MELLITUS, TYPE II (ICD-250.00)  pt will go back to 1/2 tab glimepiride two times a day and recheck labs in May Her updated medication list for this problem includes:    Benicar 20 Mg Tabs (Olmesartan medoxomil) .Marland Kitchen... 1 by mouth once daily    Glimepiride 2 Mg Tabs (Glimepiride) .Marland Kitchen... Take one tablet daily  Labs Reviewed: Creat: 1.2 (12/15/2009)     Last Eye Exam: diabetic retinopathy (04/12/2009) Reviewed HgBA1c results: 7.0 (12/15/2009)  6.6 (05/25/2009)  Orders: Prescription Created Electronically [phone])  Problem # 2:  HYPERTENSION (ICD-401.9)  Her updated medication list for this problem  includes:    Benicar 20 Mg Tabs (Olmesartan medoxomil) .Marland Kitchen... 1 by mouth once daily    Atenolol 100 Mg Tabs (Atenolol) .Marland Kitchen... 1 by mouth once daily    Indapamide 1.25 Mg Tabs (Indapamide) .Marland Kitchen... 1 by mouth once daily  BP today: 122/80 Prior BP: 130/74 (11/27/2009)  Labs Reviewed: K+: 4.5 (12/15/2009) Creat: : 1.2 (12/15/2009)   Chol: 216 (11/22/2009)   HDL: 50.90 (11/22/2009)   LDL: DEL (01/11/2009)   TG: 223.0 (11/22/2009)  Orders: Prescription Created Electronically (  [email or web address])  Problem # 3:  HYPERLIPIDEMIA (ICD-272.4) per lipid clinic Her updated medication list for this problem includes:    Fenofibrate 54 Mg Tabs (Fenofibrate) .Marland Kitchen... 1 by mouth daily    Vytorin 10-20 Mg Tabs (Ezetimibe-simvastatin) .Marland Kitchen... Take one tablet by mouth every other day at bedtime  Complete Medication List: 1)  Tramadol Hcl 50 Mg Tabs (Tramadol hcl) .Marland Kitchen.. 1 by mouth three times a day as needed 2)  Benicar 20 Mg Tabs (Olmesartan medoxomil) .Marland Kitchen.. 1 by mouth once daily 3)  Atenolol 100 Mg Tabs (Atenolol) .Marland Kitchen.. 1 by mouth once daily 4)  Indapamide 1.25 Mg Tabs (Indapamide) .Marland Kitchen.. 1 by mouth once daily 5)  Glimepiride 2 Mg Tabs (Glimepiride) .... Take one tablet daily 6)  Advair Diskus 250-50 Mcg/dose Misc (Fluticasone-salmeterol) .... 2 puff once daily 7)  Astelin 137 Mcg/spray Soln (Azelastine hcl) .... 2 sprays each nostril two times a day 8)  Multivitamins Tabs (Multiple vitamin) .... Take 1 tablet by mouth daily 9)  Ascorbic Acid 500 Mg Tabs (Ascorbic acid) .Marland Kitchen.. 1 by mouth daily. 10)  Vitamin E 400 Unit Caps (Vitamin e) .Marland Kitchen.. 1 by mouth daily. 11)  Calcium 600 1500 Mg Tabs (Calcium carbonate) .Marland Kitchen.. 1 by mouth daily. 12)  Fish Oil  .... Take by mouth as directed 13)  Flax Oil (Flaxseed (linseed)) .... Take by mouth as directed 14)  Fenofibrate 54 Mg Tabs (Fenofibrate) .Marland Kitchen.. 1 by mouth daily 15)  Proair Hfa 108 (90 Base) Mcg/act Aers (Albuterol sulfate) .... 2 puffs qid as needed 16)  Vitamin D 1000 Unit Tabs  (Cholecalciferol) .... Once daily 17)  Hyomax-sl 0.125 Mg Subl (Hyoscyamine sulfate) .... Take 2 tabs sublingual before each meal if the vegetables are eaten 18)  Vytorin 10-20 Mg Tabs (Ezetimibe-simvastatin) .... Take one tablet by mouth every other day at bedtime  Other Orders: Flu Vaccine 51yrs + (09811) Administration Flu vaccine - MCR (B1478) Prescriptions: GLIMEPIRIDE 2 MG  TABS (GLIMEPIRIDE) take one tablet daily  #90 x 3   Entered and Authorized by:   Loreen Freud DO   Signed by:   Loreen Freud DO on 12/27/2009   Method used:   Electronically to        [facility] (retail)       [address]       [place], [place]  [address]       Ph: [phone]       Fax: [phone]   RxID:   [id number] BENICAR 20 MG  TABS (OLMESARTAN MEDOXOMIL) 1 by mouth once daily  #30 x 2   Entered and Authorized by:   Loreen Freud DO   Signed by:   Loreen Freud DO on 12/27/2009   Method used:   Electronically to        [facility]* (retail)       [address]       [place], [place]  [address]       Ph: [phone]       Fax: [phone]   RxID:   [id number] TRAMADOL HCL 50 MG TABS (TRAMADOL HCL) 1 by mouth three times a day as needed  #90 x 2   Entered and Authorized by:   Loreen Freud DO   Signed by:   Loreen Freud DO on 12/27/2009   Method used:   Electronically to        [facility]* (retail)  [address]       [place], [place]  [address]       Ph: [phone]       Fax: [phone]   RxID:   [id number]  Flu Vaccine Consent Questions     Do you have a history of severe allergic reactions to this vaccine? no    Any prior history of allergic reactions to egg and/or gelatin? no    Do you have a sensitivity to the preservative Thimersol? no    Do you have a past history of Guillan-Barre Syndrome? no    Do you currently have an acute febrile illness? no    Have you ever had a severe reaction to latex?  no    Vaccine information given and explained to patient? yes    Are you currently pregnant? no    Lot Number:[id number]   Exp Date:05/03/2010   Site Given  right Deltoid IM       [address]       [place], [place]  [address]       Ph: [phone]       Fax: [phone]   RxID:   [id number]  .lbmedflu

## 2010-12-04 NOTE — Letter (Signed)
Summary: Revised CMN for Diabetes Supplies/[facility]  Revised CMN for Diabetes Supplies/[facility]   Imported By: Lanelle Bal 01/02/2010 10:12:00  _____________________________________________________________________  External Attachment:    Type:   Image     Comment:   External Document

## 2010-12-04 NOTE — Assessment & Plan Note (Signed)
Summary: rov lipid - [facility]   [name] is seen back in the lipid clinic.  She has not tolerated vytorin 10/40 every 3 days since her last visit. She says the day after the dose she has left arm pain limiting movement.  This goes away the next day.  She did not have this pain with the Vytorin 10/20mg  every 3 days.  She continues to follow a relatively low-cholesterol, low-fat diet.  Her exercise is limited by her leg/back discomfort.  She walks with a walker.  She states that she has had no significant changes in her health status, exercise, or diet therapies.    Lipid Management Provider  Shelby Dubin, PharmD, BCPS, CPP  Current Medications (verified): 1)  Tramadol Hcl 50 Mg Tabs (Tramadol Hcl) .Marland Kitchen.. 1-3 X Once Daily As Needed 2)  Benicar 20 Mg  Tabs (Olmesartan Medoxomil) .Marland Kitchen.. 1 By Mouth Once Daily 3)  Atenolol 100 Mg Tabs (Atenolol) .Marland Kitchen.. 1 By Mouth Once Daily 4)  Indapamide 1.25 Mg  Tabs (Indapamide) .Marland Kitchen.. 1 By Mouth Once Daily 5)  Glimepiride 2 Mg  Tabs (Glimepiride) .... Take One Tablet Daily 6)  Advair Diskus 250-50 Mcg/dose  Misc (Fluticasone-Salmeterol) .... 2 Puff Once Daily 7)  Astelin 137 Mcg/spray  Soln (Azelastine Hcl) .... 2 Sprays Each Nostril Two Times A Day 8)  Multivitamins   Tabs (Multiple Vitamin) .... Take 1 Tablet By Mouth Daily 9)  Ascorbic Acid 500 Mg Tabs (Ascorbic Acid) .Marland Kitchen.. 1 By Mouth Daily. 10)  Vitamin E 400 Unit Caps (Vitamin E) .Marland Kitchen.. 1 By Mouth Daily. 11)  Calcium 600 1500 Mg Tabs (Calcium Carbonate) .Marland Kitchen.. 1 By Mouth Daily. 12)  Fish Oil .... Take By Mouth As Directed 13)  Flax   Oil (Flaxseed (Linseed)) .... Take By Mouth As Directed 14)  Fenofibrate 54 Mg Tabs (Fenofibrate) .Marland Kitchen.. 1 By Mouth Daily 15)  Proair Hfa 108 (90 Base) Mcg/act Aers (Albuterol Sulfate) .... 2 Puffs Qid As Needed 16)  Vitamin D 1000 Unit Tabs (Cholecalciferol) .... Once Daily 17)  Hyomax-Sl 0.125 Mg Subl (Hyoscyamine Sulfate) .... Take 2 Tabs Sublingual Before Each Meal If The Vegetables Are  Eaten 18)  Vytorin 10-20 Mg Tabs (Ezetimibe-Simvastatin) .... Take One Tablet By Mouth Every Other Day At Bedtime  Allergies (verified): 1)  ! Pcn 2)  ! Codeine 3)  ! Ace Inhibitors  Past History:  Past Medical History: Last updated: 07/14/2009 Asthma Hypertension x 7 years Diabetes mellitus, type II x 5 years Breast cancer, hx of Hyperlipidemia x 15 years Osteoporosis hyperplastic polyps  2007  Past Surgical History: Last updated: 07/14/2009 Caesarean section 2  Appendectomy 1964  Family History: Last updated: 11/27/2009 Family History Hypertension No early heart disease (father with CABG in late 60s) No FH of Colon Cancer:  Social History: Last updated: 07/07/2009 Former Smoker 1 1/2 ppd for 40 years. Quit 11 years ago Alcohol use-no Drug use-no Regular exercise-yes Divorced  Risk Factors: Alcohol Use: 0 (06/07/2009) Caffeine Use: <1  (06/07/2009) Exercise: yes (06/07/2009)  Risk Factors: Smoking Status: quit (06/07/2009) Packs/Day: 1.5 (06/07/2009)  Family History: Reviewed history from 07/17/2009 and no changes required. Family History Hypertension No early heart disease (father with CABG in late 68s) No FH of Colon Cancer:  Social History: Reviewed history from 07/07/2009 and no changes required. Former Smoker 1 1/2 ppd for 40 years. Quit 11 years ago Alcohol use-no Drug use-no Regular exercise-yes Divorced   Vital Signs:  Patient profile:   76 year old female Weight:  151 pounds Resp:     16 per minute BP sitting:   130 / 74  Impression & Recommendations:  Problem # 1:  HYPERLIPIDEMIA (ICD-272.4) This panel represents good improvement in patient's overall lab values (TC: 216, TG: 223, HDL: 50.9, LDL: 133.7, normal LFTs).  I have asked her to continue q3 day vytorin, and work towards q2day vytorin if her discomfort allows.  She will call with questions or problems in the meantime. She will also let us know if she needs to hold her meds  due to worsening muscle pains.  She and her daughter seem very pleased with these results.  I have encouraged her to keep up the good work on dietary compliance.  I appreciate the opportunity to see this pleasant patient.    Her updated medication list for this problem includes:    Fenofibrate 54 Mg Tabs (Fenofibrate) .Marland Kitchen... 1 by mouth daily    Vytorin 10-20 Mg Tabs (Ezetimibe-simvastatin) .Marland Kitchen... Take one tablet by mouth every other day at bedtime

## 2010-12-08 ENCOUNTER — Emergency Department (HOSPITAL_COMMUNITY): Payer: Medicare HMO

## 2010-12-08 ENCOUNTER — Inpatient Hospital Stay (HOSPITAL_COMMUNITY): Payer: Medicare HMO

## 2010-12-08 ENCOUNTER — Other Ambulatory Visit (HOSPITAL_COMMUNITY): Payer: Medicare HMO

## 2010-12-08 ENCOUNTER — Inpatient Hospital Stay (HOSPITAL_COMMUNITY)
Admission: EM | Admit: 2010-12-08 | Discharge: 2010-12-20 | DRG: 551 | Disposition: A | Discharge status: Skilled Nursing Facility | Payer: Medicare HMO | Attending: Internal Medicine | Admitting: Internal Medicine

## 2010-12-08 DIAGNOSIS — M47817 Spondylosis without myelopathy or radiculopathy, lumbosacral region: Principal | ICD-10-CM | POA: Diagnosis present

## 2010-12-08 DIAGNOSIS — K5900 Constipation, unspecified: Secondary | ICD-10-CM | POA: Diagnosis not present

## 2010-12-08 DIAGNOSIS — Z981 Arthrodesis status: Secondary | ICD-10-CM

## 2010-12-08 DIAGNOSIS — M810 Age-related osteoporosis without current pathological fracture: Secondary | ICD-10-CM | POA: Diagnosis present

## 2010-12-08 DIAGNOSIS — J189 Pneumonia, unspecified organism: Secondary | ICD-10-CM | POA: Diagnosis present

## 2010-12-08 DIAGNOSIS — R269 Unspecified abnormalities of gait and mobility: Secondary | ICD-10-CM | POA: Diagnosis present

## 2010-12-08 DIAGNOSIS — R82998 Other abnormal findings in urine: Secondary | ICD-10-CM | POA: Diagnosis present

## 2010-12-08 DIAGNOSIS — R52 Pain, unspecified: Secondary | ICD-10-CM

## 2010-12-08 DIAGNOSIS — E119 Type 2 diabetes mellitus without complications: Secondary | ICD-10-CM | POA: Diagnosis present

## 2010-12-08 DIAGNOSIS — Z853 Personal history of malignant neoplasm of breast: Secondary | ICD-10-CM

## 2010-12-08 DIAGNOSIS — D259 Leiomyoma of uterus, unspecified: Secondary | ICD-10-CM | POA: Diagnosis present

## 2010-12-08 LAB — HEPATIC FUNCTION PANEL
ALT: 16 U/L (ref 0–35)
Alkaline Phosphatase: 66 U/L (ref 39–117)
Bilirubin, Direct: 0.2 mg/dL (ref 0.0–0.3)
Indirect Bilirubin: 0.3 mg/dL (ref 0.3–0.9)

## 2010-12-08 LAB — CBC
Platelets: 397 10*3/uL (ref 150–400)
RBC: 4.28 MIL/uL (ref 3.87–5.11)
RDW: 12.9 % (ref 11.5–15.5)
WBC: 15.5 10*3/uL — ABNORMAL HIGH (ref 4.0–10.5)

## 2010-12-08 LAB — URINE MICROSCOPIC-ADD ON

## 2010-12-08 LAB — BASIC METABOLIC PANEL
Calcium: 11.3 mg/dL — ABNORMAL HIGH (ref 8.4–10.5)
GFR calc Af Amer: 41 mL/min — ABNORMAL LOW (ref >60)
GFR calc non Af Amer: 34 mL/min — ABNORMAL LOW (ref >60)
Glucose, Bld: 174 mg/dL — ABNORMAL HIGH (ref 70–99)
Sodium: 137 mEq/L (ref 135–145)

## 2010-12-08 LAB — DIFFERENTIAL
Basophils Absolute: 0.1 10*3/uL (ref 0.0–0.1)
Basophils Relative: 0 % (ref 0–1)
Eosinophils Absolute: 0.2 10*3/uL (ref 0.0–0.7)
Eosinophils Relative: 2 % (ref 0–5)
Neutrophils Relative %: 78 % — ABNORMAL HIGH (ref 43–77)

## 2010-12-08 LAB — URINALYSIS, ROUTINE W REFLEX MICROSCOPIC
Bilirubin Urine: NEGATIVE
Ketones, ur: NEGATIVE mg/dL
Nitrite: POSITIVE — AB
Specific Gravity, Urine: 1.014 (ref 1.005–1.030)
Urobilinogen, UA: 0.2 mg/dL (ref 0.0–1.0)

## 2010-12-08 LAB — GLUCOSE, CAPILLARY: Glucose-Capillary: 98 mg/dL (ref 70–99)

## 2010-12-09 ENCOUNTER — Inpatient Hospital Stay (HOSPITAL_COMMUNITY): Payer: Medicare HMO

## 2010-12-09 ENCOUNTER — Encounter (HOSPITAL_COMMUNITY): Payer: Self-pay | Admitting: Radiology

## 2010-12-09 LAB — CBC
Platelets: 326 10*3/uL (ref 150–400)
RDW: 12.7 % (ref 11.5–15.5)
WBC: 12.8 10*3/uL — ABNORMAL HIGH (ref 4.0–10.5)

## 2010-12-09 LAB — BASIC METABOLIC PANEL
GFR calc non Af Amer: 39 mL/min — ABNORMAL LOW (ref >60)
Potassium: 3.4 mEq/L — ABNORMAL LOW (ref 3.5–5.1)
Sodium: 137 mEq/L (ref 135–145)

## 2010-12-09 LAB — GLUCOSE, CAPILLARY
Glucose-Capillary: 61 mg/dL — ABNORMAL LOW (ref 70–99)
Glucose-Capillary: 67 mg/dL — ABNORMAL LOW (ref 70–99)
Glucose-Capillary: 122 mg/dL — ABNORMAL HIGH (ref 70–99)

## 2010-12-09 LAB — HEMOGLOBIN A1C: Hgb A1c MFr Bld: 6.8 % — ABNORMAL HIGH (ref <5.7)

## 2010-12-10 ENCOUNTER — Other Ambulatory Visit (HOSPITAL_COMMUNITY): Payer: Medicare HMO

## 2010-12-10 ENCOUNTER — Inpatient Hospital Stay (HOSPITAL_COMMUNITY): Payer: Medicare HMO

## 2010-12-10 LAB — CBC
HCT: 34.1 % — ABNORMAL LOW (ref 36.0–46.0)
Hemoglobin: 11.6 g/dL — ABNORMAL LOW (ref 12.0–15.0)
MCH: 30.8 pg (ref 26.0–34.0)
MCHC: 34 g/dL (ref 30.0–36.0)

## 2010-12-10 LAB — GLUCOSE, CAPILLARY: Glucose-Capillary: 132 mg/dL — ABNORMAL HIGH (ref 70–99)

## 2010-12-10 LAB — BASIC METABOLIC PANEL
CO2: 24 mEq/L (ref 19–32)
Calcium: 9.9 mg/dL (ref 8.4–10.5)
Creatinine, Ser: 1.32 mg/dL — ABNORMAL HIGH (ref 0.4–1.2)
GFR calc Af Amer: 48 mL/min — ABNORMAL LOW (ref >60)
Glucose, Bld: 164 mg/dL — ABNORMAL HIGH (ref 70–99)

## 2010-12-10 LAB — URINE CULTURE
Culture: NO GROWTH
Special Requests: NEGATIVE

## 2010-12-10 LAB — PTH, INTACT AND CALCIUM
Calcium, Total (PTH): 10.3 mg/dL (ref 8.4–10.5)
PTH: 7.5 pg/mL — ABNORMAL LOW (ref 14.0–72.0)

## 2010-12-11 ENCOUNTER — Other Ambulatory Visit (HOSPITAL_COMMUNITY): Payer: Medicare HMO

## 2010-12-11 ENCOUNTER — Inpatient Hospital Stay (HOSPITAL_COMMUNITY): Payer: Medicare HMO

## 2010-12-11 LAB — BASIC METABOLIC PANEL
BUN: 17 mg/dL (ref 6–23)
CO2: 24 mEq/L (ref 19–32)
Chloride: 105 mEq/L (ref 96–112)
Potassium: 4.1 mEq/L (ref 3.5–5.1)

## 2010-12-11 LAB — CBC
Hemoglobin: 11.3 g/dL — ABNORMAL LOW (ref 12.0–15.0)
MCH: 30.6 pg (ref 26.0–34.0)
MCV: 90.2 fL (ref 78.0–100.0)
Platelets: 408 10*3/uL — ABNORMAL HIGH (ref 150–400)
RBC: 3.69 MIL/uL — ABNORMAL LOW (ref 3.87–5.11)
WBC: 12.6 10*3/uL — ABNORMAL HIGH (ref 4.0–10.5)

## 2010-12-11 LAB — GLUCOSE, CAPILLARY
Glucose-Capillary: 177 mg/dL — ABNORMAL HIGH (ref 70–99)
Glucose-Capillary: 136 mg/dL — ABNORMAL HIGH (ref 70–99)

## 2010-12-12 ENCOUNTER — Inpatient Hospital Stay (HOSPITAL_COMMUNITY): Payer: Medicare HMO

## 2010-12-12 LAB — COMPREHENSIVE METABOLIC PANEL
ALT: 10 U/L (ref 0–35)
Alkaline Phosphatase: 52 U/L (ref 39–117)
CO2: 25 mEq/L (ref 19–32)
Chloride: 104 mEq/L (ref 96–112)
GFR calc non Af Amer: 37 mL/min — ABNORMAL LOW (ref >60)
Glucose, Bld: 144 mg/dL — ABNORMAL HIGH (ref 70–99)
Potassium: 4.4 mEq/L (ref 3.5–5.1)
Sodium: 139 mEq/L (ref 135–145)
Total Bilirubin: 0.7 mg/dL (ref 0.3–1.2)

## 2010-12-12 LAB — CBC
HCT: 34.2 % — ABNORMAL LOW (ref 36.0–46.0)
Hemoglobin: 11.8 g/dL — ABNORMAL LOW (ref 12.0–15.0)
MCH: 31.1 pg (ref 26.0–34.0)
MCHC: 34.5 g/dL (ref 30.0–36.0)

## 2010-12-12 LAB — IGG, IGA, IGM
IgG (Immunoglobin G), Serum: 1100 mg/dL (ref 694–1618)
IgM, Serum: 196 mg/dL (ref 60–263)

## 2010-12-12 LAB — UIFE/LIGHT CHAINS/TP QN, 24-HR UR
Alpha 1, Urine: DETECTED — AB
Free Kappa Lt Chains,Ur: 11.4 mg/dL — ABNORMAL HIGH (ref 0.04–1.51)
Free Kappa/Lambda Ratio: 6.13 ratio — ABNORMAL HIGH (ref 0.46–4.00)
Free Lambda Excretion/Day: 33.48 mg/d
Free Lt Chn Excr Rate: 205.2 mg/d
Time: 24 hours
Total Protein, Urine: 50.9 mg/dL
Volume, Urine: 1800 mL

## 2010-12-12 LAB — PROTEIN ELECTROPH W RFLX QUANT IMMUNOGLOBULINS
Albumin ELP: 45.2 % — ABNORMAL LOW (ref 55.8–66.1)
Alpha-1-Globulin: 8.9 % — ABNORMAL HIGH (ref 2.9–4.9)
Alpha-2-Globulin: 16.7 % — ABNORMAL HIGH (ref 7.1–11.8)
Total Protein ELP: 6.3 g/dL (ref 6.0–8.3)

## 2010-12-12 LAB — GLUCOSE, CAPILLARY
Glucose-Capillary: 130 mg/dL — ABNORMAL HIGH (ref 70–99)
Glucose-Capillary: 127 mg/dL — ABNORMAL HIGH (ref 70–99)

## 2010-12-12 LAB — IMMUNOFIXATION ADD-ON

## 2010-12-13 ENCOUNTER — Inpatient Hospital Stay (HOSPITAL_COMMUNITY): Payer: Medicare HMO

## 2010-12-13 LAB — GLUCOSE, CAPILLARY
Glucose-Capillary: 141 mg/dL — ABNORMAL HIGH (ref 70–99)
Glucose-Capillary: 150 mg/dL — ABNORMAL HIGH (ref 70–99)

## 2010-12-14 LAB — GLUCOSE, CAPILLARY: Glucose-Capillary: 167 mg/dL — ABNORMAL HIGH (ref 70–99)

## 2010-12-14 LAB — CBC
Hemoglobin: 10.9 g/dL — ABNORMAL LOW (ref 12.0–15.0)
MCH: 30.7 pg (ref 26.0–34.0)
RBC: 3.55 MIL/uL — ABNORMAL LOW (ref 3.87–5.11)
WBC: 11.3 10*3/uL — ABNORMAL HIGH (ref 4.0–10.5)

## 2010-12-14 LAB — BASIC METABOLIC PANEL
CO2: 24 mEq/L (ref 19–32)
Chloride: 105 mEq/L (ref 96–112)
GFR calc Af Amer: 53 mL/min — ABNORMAL LOW (ref >60)
Potassium: 3.9 mEq/L (ref 3.5–5.1)
Sodium: 138 mEq/L (ref 135–145)

## 2010-12-14 LAB — DIFFERENTIAL
Basophils Absolute: 0 10*3/uL (ref 0.0–0.1)
Basophils Relative: 0 % (ref 0–1)
Monocytes Relative: 9 % (ref 3–12)
Neutro Abs: 7.7 10*3/uL (ref 1.7–7.7)
Neutrophils Relative %: 68 % (ref 43–77)

## 2010-12-15 LAB — BODY FLUID CULTURE

## 2010-12-15 LAB — GLUCOSE, CAPILLARY: Glucose-Capillary: 156 mg/dL — ABNORMAL HIGH (ref 70–99)

## 2010-12-15 LAB — CULTURE, BLOOD (ROUTINE X 2)
Culture  Setup Time: 201202051951
Culture: NO GROWTH

## 2010-12-16 LAB — GLUCOSE, CAPILLARY: Glucose-Capillary: 146 mg/dL — ABNORMAL HIGH (ref 70–99)

## 2010-12-17 DIAGNOSIS — M519 Unspecified thoracic, thoracolumbar and lumbosacral intervertebral disc disorder: Secondary | ICD-10-CM

## 2010-12-17 DIAGNOSIS — M47817 Spondylosis without myelopathy or radiculopathy, lumbosacral region: Secondary | ICD-10-CM

## 2010-12-17 LAB — BASIC METABOLIC PANEL
CO2: 23 mEq/L (ref 19–32)
Calcium: 9.5 mg/dL (ref 8.4–10.5)
Chloride: 108 mEq/L (ref 96–112)
GFR calc Af Amer: 56 mL/min — ABNORMAL LOW (ref >60)
Sodium: 139 mEq/L (ref 135–145)

## 2010-12-17 LAB — DIFFERENTIAL
Basophils Absolute: 0.1 10*3/uL (ref 0.0–0.1)
Basophils Relative: 0 % (ref 0–1)
Eosinophils Absolute: 0.4 10*3/uL (ref 0.0–0.7)
Neutro Abs: 9.6 10*3/uL — ABNORMAL HIGH (ref 1.7–7.7)
Neutrophils Relative %: 73 % (ref 43–77)

## 2010-12-17 LAB — GLUCOSE, CAPILLARY
Glucose-Capillary: 115 mg/dL — ABNORMAL HIGH (ref 70–99)
Glucose-Capillary: 140 mg/dL — ABNORMAL HIGH (ref 70–99)
Glucose-Capillary: 118 mg/dL — ABNORMAL HIGH (ref 70–99)

## 2010-12-17 LAB — CBC
Hemoglobin: 10.8 g/dL — ABNORMAL LOW (ref 12.0–15.0)
Platelets: 372 10*3/uL (ref 150–400)
RBC: 3.56 MIL/uL — ABNORMAL LOW (ref 3.87–5.11)

## 2010-12-17 LAB — URINALYSIS, ROUTINE W REFLEX MICROSCOPIC
Leukocytes, UA: NEGATIVE
Protein, ur: 100 mg/dL — AB
Urine Glucose, Fasting: NEGATIVE mg/dL
pH: 5.5 (ref 5.0–8.0)

## 2010-12-17 LAB — URINE MICROSCOPIC-ADD ON

## 2010-12-18 LAB — CBC
MCHC: 34.1 g/dL (ref 30.0–36.0)
Platelets: 374 10*3/uL (ref 150–400)
RDW: 13.1 % (ref 11.5–15.5)
WBC: 10.3 10*3/uL (ref 4.0–10.5)

## 2010-12-18 LAB — URINE CULTURE
Colony Count: NO GROWTH
Culture: NO GROWTH

## 2010-12-18 LAB — GLUCOSE, CAPILLARY
Glucose-Capillary: 95 mg/dL (ref 70–99)
Glucose-Capillary: 108 mg/dL — ABNORMAL HIGH (ref 70–99)

## 2010-12-18 LAB — DIFFERENTIAL
Basophils Absolute: 0.1 10*3/uL (ref 0.0–0.1)
Basophils Relative: 1 % (ref 0–1)
Eosinophils Absolute: 0.8 10*3/uL — ABNORMAL HIGH (ref 0.0–0.7)
Eosinophils Relative: 7 % — ABNORMAL HIGH (ref 0–5)
Lymphocytes Relative: 25 % (ref 12–46)
Monocytes Absolute: 0.7 10*3/uL (ref 0.1–1.0)

## 2010-12-19 LAB — GLUCOSE, CAPILLARY
Glucose-Capillary: 112 mg/dL — ABNORMAL HIGH (ref 70–99)
Glucose-Capillary: 198 mg/dL — ABNORMAL HIGH (ref 70–99)

## 2010-12-20 LAB — GLUCOSE, CAPILLARY: Glucose-Capillary: 124 mg/dL — ABNORMAL HIGH (ref 70–99)

## 2010-12-27 NOTE — Discharge Summary (Signed)
  NAME:  [name], [name]                    ACCOUNT NO.:  [id number]  MEDICAL RECORD NO.:  [id number]           PATIENT TYPE:  I  LOCATION:  1527                         FACILITY:  [facility]  PHYSICIAN:  Thad Ranger, MD       DATE OF BIRTH:  01-26-1935  DATE OF ADMISSION:  12/08/2010 DATE OF DISCHARGE:                              DISCHARGE SUMMARY   ADDENDUM:  DISCHARGE DISPOSITION:  Awaiting skilled nursing facility placement for short-term rehab.  Please note that the previously dictated discharge summary by Dr. Marthann Schiller on December 18, 2010, still remains the same.  FINAL DISCHARGE DIAGNOSES:  Remain the same.  DISCHARGE MEDICATIONS:  Additional is Medrol Dosepak take as directed as per the discharge instructions.  The patient is currently on day #3 of the Dosepak.  Please refer to the previously dictated discharge summary for the hospital course.  The patient is currently awaiting skilled nursing facility placement.  PHYSICAL EXAMINATION:  VITAL SIGNS:  At the time of dictation, temperature 98.7, pulse 64, respirations 16, blood pressure 131/79, O2 saturations 94% room air. GENERAL:  Alert and awake, not in any acute distress. HEENT:  Anicteric sclerae and conjunctivae.  Pupils reactive to light and accommodation.  EOMI.  NECK:  Supple.  No lymphopathy.  No JVD. CV:  S1 and S2 clear. CHEST:  Clear to auscultation bilaterally. ABDOMEN:  Soft, nontender, nondistended. EXTREMITIES:  No cyanosis, clubbing, or edema noted in upper or lower extremities bilaterally.  Briefly, 1) back pain with radiation, currently controlled.  The patient is ambulating with her walker.  Currently, the patient is on day #3 of Medrol Dosepak and to continue per instructions.  The patient will follow up with Dr. Shana Chute within next 2-3 weeks.  2) Constipation, resolved.   3) Diabetes mellitus, the patient to continue Amaryl at the time of discharge.   4) History of osteoporosis and  hypercalcemia, currently stable.  The patient was treated with pamidronate during the hospitalization.  Multiple myeloma was ruled out on the electrophoresis. The patient will continue calcium with vitamin D in the setting of degenerative joint disease.  5) History of fibroid uterus, recommendations for CT abdomen and pelvis once functional status improved and pain is controlled.  DIET:  Diabetic diet.  DISCHARGE DISPOSITION:  Awaiting skilled nursing facility placement today.  Discharge followup with Dr. Loreen Freud, within 2-3 weeks and Dr. Julio Sicks within next 2-3 weeks.  Time spent 35 minutes.     Thad Ranger, MD     RR/MEDQ  D:  12/20/2010  T:  12/20/2010  Job:  [id number]  cc:   Sherilyn Cooter A. Pool, M.D. Fax: [phone]  Lelon Perla, DO [address] [place], [place] [address]  Electronically Signed by Andres Labrum Elaysia Devargas  on 12/27/2010 12:44:44 PM

## 2010-12-27 NOTE — Discharge Summary (Signed)
[name], [name]                    ACCOUNT NO.:  [id number]  MEDICAL RECORD NO.:  [id number]           PATIENT TYPE:  I  LOCATION:  1527                         FACILITY:  [facility]  PHYSICIAN:  Altha Harm, MDDATE OF BIRTH:  Feb 27, 1935  DATE OF ADMISSION:  12/08/2010 DATE OF DISCHARGE:  12/19/2010                              DISCHARGE SUMMARY   DISCHARGE DISPOSITION:  Skilled nursing facility for short-term rehab.  FINAL DISCHARGE DIAGNOSES: 1. Gait abnormality. 2. Advanced degenerative joint disease of the lumbar spine. 3. Hypercalcemia, resolved. 4. Pneumonia, fully treated. 5. Pyuria.  Urine cultures negative. 6. Constipation being treated. 7. Diabetes type 2, well controlled. 8. Osteoporosis. 9. Fibroid uterus. 10.History of breast cancer. 11.History of hyperplastic polyps.  Discharge medications include the following: 1. Amlodipine 10 mg p.o. daily. 2. Docusate sodium 100 mg p.o. b.i.d. 3. Oxycodone IR 5 mg p.o. q.4 h. p.r.n. pain. 4. Polyethylene glycol 17 g p.o. daily as needed. 5. Psyllium powder pack, 1 pack by mouth daily as needed. 6. Advair Diskus 250/50 mcg 2 puffs inhaled daily. 7. Astelin 2 sprays nasally twice daily as needed for congestion. 8. Atenolol 100 mg p.o. daily. 9. Benicar 20 mg p.o. daily. 10.Calcium carbonate 500 mg p.o. daily. 11.Fish oil 1 capsule by mouth t.i.d. 12.Flaxseed oil over-the-counter 1 capsule by mouth daily. 13.Amaryl 1 mg p.o. b.i.d. 14.Hyoscyamine 0.125 mg 2 tablets under tongue 3 times a day before as     needed if eating vegetables. 15.Indapamide 1.25 mg p.o. daily. 16.Multivitamins 1 tablet p.o. daily. 17.ProAir inhaler 2 puffs inhale q.6 h. p.r.n. shortness of breath. 18.Tramadol 50 mg p.o. t.i.d. p.r.n. pain. 19.TriCor 54 mg p.o. daily. 20.Vitamin C 500 mg p.o. daily. 21.Vitamin D3, 1000 units p.o. daily. 22.Vitamin E 400 units p.o. daily. 23.Vytorin 10/20 mg p.o. every 3 days.  CONSULTANTS: 1. Phone  consultation with Dr. Jordan Likes, Neurosurgery. 2. Interventional Radiology.  PROCEDURES:  Disk aspiration done by Interventional Radiology on December 12, 2010.  DIAGNOSTIC STUDIES: 1. X-ray of the lumbar spine, which shows degenerative changes at L3     to L4.  This is solid L4-L5 fusion and degenerative changes at L5,     L1. 2. Study one-view chest x-ray done on admission, which shows airspace     disease at the left costophrenic uncle likely representing     pneumonia. 3. Bilateral hip and pelvis x-rays, which shows moderate right hip     osteoarthritis.  No acute osseous abnormality. 4. Postoperative changes and degenerative disease of the lumbar spine     and pubic symphysis. 5. MR of the lumbar spine without contrast, which shows status post L4-     L5 PLIF. 6. Prominent disk degeneration at L3-L4 with associated     anterolisthesis contributing to moderate central biforaminal     stenosis.  Prominent T2 hyperintensity in the disk, end plate edema     and surrounding soft tissue edema concerning for possible diskitis     and early osteomyelitis.  Similar findings can be seen with     advanced disk degeneration. 7. No other significant spinal  stenosis or nerve root encroachment. 8. Left distal mass, difficult to exclude, although maybe related to     the sigmoid colon or an exophytic uterine fibroid.  This is only     partially imaged on the current examination.  CT suggested for     further evaluation.  STUDIES:  Chest x-ray the 9th, which shows right PICC line tip overlying the mid SVC.  PRIMARY CARE PHYSICIAN:  Dr. Loreen Freud.  CODE STATUS:  Full code.  ALLERGIES: 1. PENICILLIN. 2. CODEINE.  CHIEF COMPLAINT:  Back pain.  HISTORY OF PRESENT ILLNESS:  Please refer to the H and P by Dr. Selena Batten. However in short, this is a 76 year old female with a history of prior back surgery who presents with complaints of back pain radiating to the bilateral inguinal  areas.  HOSPITAL COURSE: 1. Back pain with radiation.  The patient was initially evaluated with     a x-ray and then a lumbosacral MRI.  The findings were noted above.     In light of the fact, the patient is having significant symptoms.     Disk aspiration was done to eliminate diskitis as the cause of her     pain.  The culture showed no organism growth and that is final.  I     spoke with Dr. Jordan Likes, Neurosurgery by phone consultation.  He     reviewed the studies and recommended the patient put on a Medrol     Dosepak.  He also recommended against any epidural intramuscular     injections at this time.  He recommends that the patient follow up     with him as an outpatient for consultation at this time to discuss     possible laminectomy and fusion of the L3-L4 lumbar spine.  The patient has been fitted with lumbosacral corset and recommendations are for her to engage in therapy to restore her functional ability.  The patient did have an elevated white blood cell count.  Thus I deferred starting the Medrol Dosepak until this was reevaluated.  The patient's white blood cell count is now down to normal today and she will be started on Medrol Dosepak today to be completed over 6 days. 1. Community-acquired pneumonia.  The patient was diagnosed with     community-acquired pneumonia, can be treated with Levaquin.  She is     without any need for oxygen and her white blood cell count is     normal and the patient has had no further fever. 2. Pyuria.  The patient's urine was evaluated and she was initially     noted to have pyuria.  However, her urine cultures were non-     yielding and the patient was not continued on any further     antibiotics.  The patient had a repeat urinalysis yesterday, which     was completely normal. 3. Constipation.  The patient has been constipated significantly and     despite recommendations for laxatives as the patient had not had a     bowel movement for  over 6 days.  The patient refused laxative     initially.  However, she finally relented and took the MiraLax and     had 2 bowel movements.  The patient again has been without bowel     movement today.  We discussed at length her need to maintain good     GI toiletry and thus the patient will be discharged on a stool  softener as well as MiraLax to be used on a p.r.n. basis. 4. Diabetes type 2.  The patient is a known diabetic type 2.  She is     usually maintained on Amaryl with good outpatient control.  Here     the patient was using sliding scale insulin due to her changing and     unfolding clinical course.  However, the patient will be discharged     on her Amaryl without any sliding scale. 5. Osteoporosis and hypercalcemia.  The patient was found to be     hypercalcemic upon admission.  However, I feel that this was likely     secondary to a dehydrated state.  The patient was treated with     pamidronate and her calcium is down to normal.  Studies were done     to evaluate for multiple myeloma.  However, there was no     restriction or spike on her electrophoresis.  The patient is being     put back on her calcium and vitamin D2 due to her osteoporosis in     the setting of degenerative joint disease. 6. Fibroid uterus.  The patient was found to have possible fibroid     uterus upon evaluation.  Recommendation has been made for CT to     further evaluate; however, thus far the patient has not been able     to tolerate, reclining to be able to tolerate a CT.  At this time,     they recommended the patient have a CT when her pain is better     controlled and if her functional status is improved, to further     evaluate the findings on the CT scan.  The patient's condition at discharge will be dictated by the discharging physician.  The patient is scheduled to go to skilled nursing facility for short-term rehab.  DIETARY RESTRICTIONS:  The patient should be on a diabetic  diet.  PHYSICAL RESTRICTIONS:  Activity as tolerated under the direction of Physical Therapy.  Dr. Jordan Likes also recommends that the patient make an appointment to see him within the next 2 to 3 weeks.  Total Time For Discharge >30 minutes.     Altha Harm, MD     MAM/MEDQ  D:  12/18/2010  T:  12/18/2010  Job:  [id number]  cc:   Lelon Perla, DO [address] [place], [place] [address]  Kathaleen Maser. Pool, M.D. Fax: [phone]  Electronically Signed by Marthann Schiller MD on 12/27/2010 02:10:56 PM

## 2011-01-09 ENCOUNTER — Telehealth: Payer: Self-pay | Admitting: Family Medicine

## 2011-01-11 ENCOUNTER — Telehealth (INDEPENDENT_AMBULATORY_CARE_PROVIDER_SITE_OTHER): Payer: Self-pay | Admitting: *Deleted

## 2011-01-14 ENCOUNTER — Encounter: Payer: Self-pay | Admitting: Family Medicine

## 2011-01-15 NOTE — Progress Notes (Signed)
Summary: order for physical therapy  Phone Note From Other Clinic Call back at [phone]   Caller: Bertha Stakes Summary of Call: left msg on voicemail would like verbal order for physical therapy 3 times a week for 3 wks and 2 times a week for 2 weeks for strengthening, home safety, pain management, and balancing. Initial call taken by: Doristine Devoid CMA,  January 09, 2011 8:29 AM  Follow-up for Phone Call        ok to give verbal order Follow-up by: Loreen Freud DO,  January 09, 2011 9:29 AM  Additional Follow-up for Phone Call Additional follow up Details #1::        Left detail message with verbal ok of order.Marland KitchenMarland KitchenMarland KitchenFelecia Deloach CMA  January 09, 2011 10:12 AM

## 2011-01-21 ENCOUNTER — Encounter (INDEPENDENT_AMBULATORY_CARE_PROVIDER_SITE_OTHER): Payer: Self-pay | Admitting: *Deleted

## 2011-01-21 ENCOUNTER — Other Ambulatory Visit: Payer: Self-pay | Admitting: Family Medicine

## 2011-01-21 ENCOUNTER — Other Ambulatory Visit: Payer: Medicare HMO

## 2011-01-21 ENCOUNTER — Encounter: Payer: Self-pay | Admitting: Family Medicine

## 2011-01-21 DIAGNOSIS — E785 Hyperlipidemia, unspecified: Secondary | ICD-10-CM

## 2011-01-21 DIAGNOSIS — E119 Type 2 diabetes mellitus without complications: Secondary | ICD-10-CM

## 2011-01-21 DIAGNOSIS — M818 Other osteoporosis without current pathological fracture: Secondary | ICD-10-CM

## 2011-01-21 LAB — BASIC METABOLIC PANEL
CO2: 27 mEq/L (ref 19–32)
Chloride: 101 mEq/L (ref 96–112)
Potassium: 5 mEq/L (ref 3.5–5.1)

## 2011-01-21 LAB — LIPID PANEL
Cholesterol: 382 mg/dL — ABNORMAL HIGH (ref 0–200)
Total CHOL/HDL Ratio: 8
VLDL: 71.4 mg/dL — ABNORMAL HIGH (ref 0.0–40.0)

## 2011-01-21 LAB — HEPATIC FUNCTION PANEL
ALT: 18 U/L (ref 0–35)
AST: 26 U/L (ref 0–37)
Alkaline Phosphatase: 62 U/L (ref 39–117)
Bilirubin, Direct: 0.1 mg/dL (ref 0.0–0.3)
Total Bilirubin: 0.6 mg/dL (ref 0.3–1.2)
Total Protein: 7 g/dL (ref 6.0–8.3)

## 2011-01-21 LAB — LDL CHOLESTEROL, DIRECT: Direct LDL: 234.9 mg/dL

## 2011-01-22 LAB — CONVERTED CEMR LAB: Vit D, 25-Hydroxy: 55 ng/mL (ref 30–89)

## 2011-01-22 NOTE — Miscellaneous (Signed)
Summary: Medication Issue/[facility]  Medication Issue/[facility]   Imported By: Maryln Gottron 01/17/2011 11:18:32  _____________________________________________________________________  External Attachment:    Type:   Image     Comment:   External Document

## 2011-01-22 NOTE — Progress Notes (Signed)
Summary: lab at [facility] 0319.12  Phone Note Call from Patient Call back at Home Phone [phone] Call back at Cell: [phone]   Caller: Patient Call For: Loreen Freud DO Summary of Call: Patient is requesting labs to be drawn prior to her office visit Initial call taken by: Barnie Mort,  January 11, 2011 1:05 PM  Follow-up for Phone Call        272.4 250.00  272.4,  733.0  lipid, bmp, hep, hgba1c, vita D,  microalbumin Follow-up by: Loreen Freud DO,  January 13, 2011 11:38 AM  Additional Follow-up for Phone Call Additional follow up Details #1::        Appt 01/24/11 with Dr.Lowne please achedule labs prior..Thank You Additional Follow-up by: Almeta Monas CMA Duncan Dull),  January 14, 2011 8:36 AM    Additional Follow-up for Phone Call Additional follow up Details #2::    lmom fro patient to call & schedule lab appt .Marland KitchenOkey Regal Spring  January 14, 2011 4:17 PM appt scheduled [facility] [phone].Okey Regal Spring  January 18, 2011 8:38 AM

## 2011-01-24 ENCOUNTER — Ambulatory Visit: Payer: Medicare HMO | Admitting: Family Medicine

## 2011-01-24 ENCOUNTER — Ambulatory Visit (INDEPENDENT_AMBULATORY_CARE_PROVIDER_SITE_OTHER): Payer: Medicare HMO | Admitting: Family Medicine

## 2011-01-24 ENCOUNTER — Encounter: Payer: Self-pay | Admitting: Family Medicine

## 2011-01-24 VITALS — BP 120/90 | HR 60 | Temp 98.6°F | Wt 137.8 lb

## 2011-01-24 DIAGNOSIS — E119 Type 2 diabetes mellitus without complications: Secondary | ICD-10-CM

## 2011-01-24 DIAGNOSIS — E785 Hyperlipidemia, unspecified: Secondary | ICD-10-CM

## 2011-01-24 DIAGNOSIS — M549 Dorsalgia, unspecified: Secondary | ICD-10-CM

## 2011-01-24 DIAGNOSIS — I10 Essential (primary) hypertension: Secondary | ICD-10-CM

## 2011-01-24 DIAGNOSIS — R21 Rash and other nonspecific skin eruption: Secondary | ICD-10-CM

## 2011-01-24 DIAGNOSIS — J189 Pneumonia, unspecified organism: Secondary | ICD-10-CM

## 2011-01-24 MED ORDER — FENOFIBRATE 54 MG PO TABS: 54.0000 mg | ORAL_TABLET | Freq: Every day | ORAL | Status: DC

## 2011-01-24 MED ORDER — LOSARTAN POTASSIUM 50 MG PO TABS: 50.0000 mg | ORAL_TABLET | Freq: Every day | ORAL | Status: DC

## 2011-01-24 MED ORDER — GLIMEPIRIDE 2 MG PO TABS: 2.0000 mg | ORAL_TABLET | Freq: Every day | ORAL | Status: DC

## 2011-01-24 MED ORDER — METHOCARBAMOL 500 MG PO TABS: 500.0000 mg | ORAL_TABLET | Freq: Four times a day (QID) | ORAL | Status: DC | PRN

## 2011-01-24 MED ORDER — OXYCODONE HCL 5 MG PO CAPS: 5.0000 mg | ORAL_CAPSULE | ORAL | Status: DC | PRN

## 2011-01-24 MED ORDER — INDAPAMIDE 1.25 MG PO TABS: 1.2500 mg | ORAL_TABLET | Freq: Every day | ORAL | Status: DC

## 2011-01-24 MED ORDER — CLOTRIMAZOLE-BETAMETHASONE 1-0.05 % EX CREA: 1.0000 "application " | TOPICAL_CREAM | Freq: Two times a day (BID) | CUTANEOUS | Status: DC

## 2011-01-24 MED ORDER — ATENOLOL 100 MG PO TABS: 100.0000 mg | ORAL_TABLET | Freq: Every day | ORAL | Status: DC

## 2011-01-24 NOTE — Assessment & Plan Note (Signed)
Labs reviewed  con't meds  Recheck 3 months

## 2011-01-24 NOTE — Assessment & Plan Note (Signed)
Start vytorin 10/20 ---f/u lipid clinic

## 2011-01-24 NOTE — Assessment & Plan Note (Signed)
Start cozaar 50 mg daily -=---recheck 2-3 weeks D/c benicar

## 2011-01-24 NOTE — Assessment & Plan Note (Signed)
F/u Neuro surgery con't pain meds

## 2011-01-24 NOTE — H&P (Signed)
NAMEJELISA, [name]                    ACCOUNT NO.:  [id number]  MEDICAL RECORD NO.:  [id number]           PATIENT TYPE:  E  LOCATION:  WLED                         FACILITY:  [facility]  PHYSICIAN:  Massie Maroon, MD        DATE OF BIRTH:  03-28-1935  DATE OF ADMISSION:  12/08/2010 DATE OF DISCHARGE:                             HISTORY & PHYSICAL   CHIEF COMPLAINT:  Back pain.  HISTORY OF PRESENT ILLNESS:  A 76 year old female with a history of a prior back surgery, complains of back pain, radiating to the bilateral inguinal areas.  Pain is sharp and worse with movement.  Radiates to both thighs.  The pain started on January 22nd.  She cannot recall any inciting factor.  The patient denies any focal weakness, numbness, tingling.  The patient also denies any fever, chills, flank pain, dysuria, hematuria.  The patient's x-rays showed evidence of prior back surgery, severe disk disease at L3-L4, L5-S1 relatively stable, vascular calcifications, lower lumbar facet arthropathy, uterine fibroids, and a solid L4-L5 fusion. The patient was given morphine 4 mg IV x2 in the ED along with Valium and still remains in pain.  She will be admitted for intractable back pain as well as hypercalcemia.  PAST MEDICAL HISTORY: 1. Asthma. 2. Hypertension for 7 years. 3. Diabetes type 2 for 5 years. 4. Hyperlipidemia for 15 years. 5. Osteoporosis. 6. History of breast cancer. 7. Hyperplastic polyps, diagnosed in 2007.  PAST SURGICAL HISTORY:  Back surgery, C-section x2, appendectomy in 1964.  SOCIAL HISTORY:  The patient is a former smoker and quit about 11 years ago.  She smoked one and half packs per day for 40 years.  She does not drink.  She is divorced.  FAMILY HISTORY:  Her mother was an alcoholic and had arthritis and used to smoke.  Her father died in late 35s and had bypass surgery and was nonsmoker.  There is no family history of colon cancer.  ALLERGIES: 1. CODEINE. 2.  PENICILLIN.  MEDICATIONS: 1. Tramadol 50 mg p.o. t.i.d. 2. Benicar 20 mg p.o. daily. 3. Atenolol 100 mg p.o. daily. 4. Indapamide 1.25 mg p.o. daily. 5. Glimepiride 2 mg p.o. daily. 6. Advair Diskus 250/50 mcg 1 puff b.i.d. 7. Astelin 137 mcg per spray 2 sprays intranasally b.i.d. 8. Multivitamin 1 p.o. daily. 9. Vitamin C 500 mg p.o. daily. 10.Vitamin E 400 international units p.o. daily. 11.Calcium 1 p.o. daily. 12.Fish oil take as directed. 13.Flaxseed oil take by mouth as directed. 14.Fenofibrate 45 mg p.o. daily. 15.ProAir HFA 2 puffs q.6 hours p.r.n. 16.Vitamin B 1 p.o. daily. 17.HyoMax 0.125 mg 2 a.c. meals. 18.Vytorin 10/20 one p.o. q.o.d. at night.  REVIEW OF SYSTEMS:  The patient does take calcium supplement, she has not increased this recently.  Negative for all 10-organ systems except for pertinent positives stated above.  PHYSICAL EXAMINATION:  VITAL SIGNS:  Temperature 98.5, pulse 80, blood pressure 140/77, pulse oximetry 90% on room air. HEENT:  Anicteric. NECK:  No JVD.  No bruit. HEART:  Regular rate and rhythm.  S1 and S2.  No  murmurs, gallops, or rubs. LUNGS:  Clear to auscultation bilaterally. ABDOMEN:  Soft, nontender, nondistended.  Positive bowel sounds. EXTREMITIES:  No cyanosis, clubbing, or edema. SKIN:  No rashes. LYMPH NODES:  No adenopathy. NEUROLOGIC:  Nonfocal.  Cranial nerves II through XII intact.  Reflexes 2+, symmetric, diffuse with downgoing toes bilaterally, motor strength 5/5 in all 4 extremities, pinprick intact.  Straight leg raise negative.  ASSESSMENT/PLAN: 1. Hypercalcemia:  Check liver function tests, check an ionized     calcium; if calcium is indeed high, the patient will need to have     PTH related peptide, magnesium, phosphorus, TSH, vitamin D25-OH,     vitamin B1, 25-OH, SPEP, UPEP, and hydration with normal saline IV     and possibly the addition of furosemide and either Zometa or     pamidronate. 2. Intractable back  pain:  We will try some Dilaudid. 3. Hypoxemia:  This may be due to? sleep apnea versus narcotics:  We     will continue to monitor this.  Chest x-ray PA and lateral. 4. Diabetes type 2:  Continue present medications.  Fingerstick blood     sugars a.c. at bedtime, NovoLog sensitive     sliding scale. 5. Hyperlipidemia:  Continue Vytorin q.o.d. and fenofibrate. 6. Deep vein thrombosis prophylaxis with SCDs.     Massie Maroon, MD     JYK/MEDQ  D:  12/08/2010  T:  12/08/2010  Job:  [id number]  cc:   Lelon Perla, DO [address] [place], [place] [address]  Electronically Signed by Pearson Grippe MD on 01/24/2011 03:29:01 PM

## 2011-01-24 NOTE — Progress Notes (Signed)
  Subjective:    Patient ID: [name], female    DOB: January 25, 1935, 76 y.o.   MRN: [id number]  HPI Pt here f/u hospital for pneumonia and back pain.  Pt went to ER 12/07/2010 for back and pneumonia.  See hosp d/c summary.   Pt was in 10 days before PT got her out of bed.  Pt was sent to rehab for strenthening etc.  Pt was found to have uterine fibroids and was referred to GYN.  She has appointment 4/23 with Dr Renaldo Fiddler.   Pt needs f/u cxr for check pneumonia. Pt also still with back pain.   Pt also needs f/u with Dr Dutch Quint for back pain.      Review of Systems  Constitutional: Negative.   Respiratory: Negative.   Cardiovascular: Negative.   Musculoskeletal: Positive for back pain, arthralgias and gait problem.       Objective:   Physical Exam  Constitutional: She appears well-developed and well-nourished. No distress.  Cardiovascular: Normal rate and regular rhythm.   No murmur heard. Pulmonary/Chest: No respiratory distress. She has no wheezes. She has no rales. She exhibits no tenderness.  Musculoskeletal: She exhibits no edema.  Neurological:       Pt walking with walker and c/o back pain   Skin: She is not diaphoretic.          Assessment & Plan:

## 2011-01-24 NOTE — Assessment & Plan Note (Signed)
abx finished Recheck cxr

## 2011-01-28 ENCOUNTER — Ambulatory Visit (INDEPENDENT_AMBULATORY_CARE_PROVIDER_SITE_OTHER): Payer: Medicare HMO

## 2011-01-28 VITALS — BP 116/68 | HR 75 | Wt 136.0 lb

## 2011-01-28 DIAGNOSIS — E785 Hyperlipidemia, unspecified: Secondary | ICD-10-CM

## 2011-01-28 NOTE — Patient Instructions (Signed)
Restart taking Vytorin 10/20 every 3rd day and fenofibrate 60mg  daily  Try to continue eating a low-fat, low-cholesterol diet  Try to do as much with physical therapy as possible  Recheck labs in 1 month.

## 2011-01-29 NOTE — Progress Notes (Signed)
[name] presents to Lipid Clinic today for initial evaluation.  She is accompanied by her son-in-law.  She has previously been a pt of the lipid clinic but has not been seen in >1 year.  Since then she has been maintained on Vytorin 10/20 every 3rd day and fenofibrate 54 mg daily.  Last October her lipid panel was as follows- TC- 228, TG- 339, HDL-42.7, and LDL-139.2.  Unfortunately she ran out of her medication in December and has not taken any Vytorin or fenofibrate since then.  Her most current lipid panel showed TC- 382, TG- 357, HDL- 50, and LDL-234.9.  She has tried many other therapies in the past and the combination of Vytorin every 3rd day and fenofibrate is the only thing she has been able to tolerate for a significant amount of time.     [name] diet has been irregular over the past month.  She was recently in the hospital for an extended time then went to skilled nursing facility to help with her gait.  She stated the hospital food was okay but she did not each much while at the nursing facility.  She regularly skips breakfast.  Lunch may be a bagel with peanut butter or cream cheese.  Dinner is soup, [place], sub sandwiches, etc.  Her family is helping her with meals and they are doing a lot of take out.  She has also increased the desserts she eats since being home.  Her fasting CBGs are within range (87-135) over the past few weeks with most <100.  She likes to drink Sprite Zero, hot or unsweetened tea, or water.  Due to her unsteady gait, she cannot do traditional exercises.  She has been working with a physical therapist 3 days/week.   Current Outpatient Prescriptions on File Prior to Visit  Medication Sig Dispense Refill  . albuterol (PROAIR HFA) 108 (90 BASE) MCG/ACT inhaler Inhale 2 puffs into the lungs 4 (four) times daily.        . Ascorbic Acid (VITAMIN C) 500 MG tablet Take 500 mg by mouth daily.        Marland Kitchen atenolol (TENORMIN) 100 MG tablet Take 1 tablet (100 mg total) by mouth  daily.  90 tablet  3  . azelastine (ASTELIN) 137 MCG/SPRAY nasal spray 1 spray by Nasal route 2 (two) times daily. Use in each nostril as directed       . cholecalciferol (VITAMIN D) 1000 UNITS tablet Take 1,000 Units by mouth daily.        . clotrimazole-betamethasone (LOTRISONE) cream Apply 1 application topically 2 (two) times daily.  30 g  2  . fenofibrate 54 MG tablet Take 1 tablet (54 mg total) by mouth daily.  90 tablet  3  . Fish Oil OIL Take 1 tablet by mouth daily.        . Flaxseed, Linseed, (FLAX SEED OIL PO) Take by mouth daily.        . Fluticasone-Salmeterol (ADVAIR DISKUS) 250-50 MCG/DOSE AEPB Inhale 2 puffs into the lungs daily.        Marland Kitchen glimepiride (AMARYL) 2 MG tablet Take 1 tablet (2 mg total) by mouth daily.  90 tablet  3  . hyoscyamine (LEVSIN SL) 0.125 MG SL tablet Place 0.125 mg under the tongue every 4 (four) hours as needed.        . indapamide (LOZOL) 1.25 MG tablet Take 1 tablet (1.25 mg total) by mouth daily.  90 tablet  3  . losartan (COZAAR)  50 MG tablet Take 1 tablet (50 mg total) by mouth daily.  30 tablet  2  . methocarbamol (ROBAXIN) 500 MG tablet Take 1 tablet (500 mg total) by mouth every 6 (six) hours as needed.  60 tablet  0  . Multiple Vitamin (MULTIVITAMIN) tablet Take 1 tablet by mouth daily.        Marland Kitchen oxycodone (OXY-IR) 5 MG capsule Take 1 capsule (5 mg total) by mouth every 4 (four) hours as needed.  60 capsule  0  . traMADol (ULTRAM) 50 MG tablet Take 50 mg by mouth every 6 (six) hours as needed.        . vitamin E 400 UNIT capsule Take 400 Units by mouth daily.         Past Medical History  Diagnosis Date  . Asthma   . Hypertension   . Diabetes mellitus     Type 2  . Breast cancer   . Hyperlipidemia   . Osteoporosis   . Hyperplastic polyps of stomach    Family History  Problem Relation Age of Onset  . Hypertension    . Hyperlipidemia Mother   . Heart disease Father     ASSESSMENT AND PLAN:

## 2011-01-29 NOTE — Assessment & Plan Note (Signed)
Pt's cholesterol significantly worse without medications.  She saw Dr. Laury Axon last week and was given samples of Vytorin and a new Rx for fenofibrate.  In the past, this has not gotten pt to goal but given pt's hx of intolerances to other therapies, will try again.  Will re-evaluate in 2 months to determine if therapy should be increased.  Encouraged pt to continue lifestyle modifications with her diet.

## 2011-01-30 ENCOUNTER — Encounter: Payer: Self-pay | Admitting: *Deleted

## 2011-01-30 ENCOUNTER — Telehealth: Payer: Self-pay | Admitting: *Deleted

## 2011-01-30 NOTE — Telephone Encounter (Signed)
Left message to call office. Call in reference to labs done 01-21-11.  protein in urine---need to check 24 urine for protein and creatinine

## 2011-02-04 ENCOUNTER — Telehealth: Payer: Self-pay | Admitting: *Deleted

## 2011-02-04 NOTE — Telephone Encounter (Signed)
Ok to give verbal order.

## 2011-02-04 NOTE — Telephone Encounter (Signed)
Pt aware will come in this week to pick up container.

## 2011-02-04 NOTE — Telephone Encounter (Signed)
Left detail message ok to continue with PT and to return call with any further concerns.

## 2011-02-04 NOTE — Telephone Encounter (Signed)
Verbal order to continue PT 3X a week for 1 week then 2X a week for 3 weeks to help with pain management, balancing, gait, strenghten, and safety. Please advise

## 2011-02-13 ENCOUNTER — Telehealth: Payer: Self-pay | Admitting: Family Medicine

## 2011-02-13 DIAGNOSIS — M549 Dorsalgia, unspecified: Secondary | ICD-10-CM

## 2011-02-13 NOTE — Telephone Encounter (Signed)
Spoke with patient and she stated she is still having pain, and has had to take the medication daily and it is only lasting her 10 days instead of 30. I advised the patient this is not a medication that should be taken daily it is an as needed med and this is how it is prescribed. She voiced understanding, she stated she has needed it and has had to take it everyday every 4 hours. She is currently not in pain management and is doing PT twice a week, she is only getting little relief from PT. She would like to get an increase in the quantity of her Oxycodone, she is currently getting 60 pills and it was filled 01/24/11 c/b # [phone]   Please advise     KP

## 2011-02-14 MED ORDER — OXYCODONE HCL 5 MG PO CAPS: 5.0000 mg | ORAL_CAPSULE | ORAL | Status: AC | PRN

## 2011-02-14 NOTE — Telephone Encounter (Addendum)
Pt states that she has not seen dr Dutch Quint yet and does not have pending appt. Pt advised to call to schedule appt and when she does see him she need to discuss increase in pain, Pt ok will call to schedule appt. Per dr Laury Axon can increase Rx to #90, rx ready for pick-up.

## 2011-02-14 NOTE — Telephone Encounter (Signed)
Has she seen Dr Sharyn Creamer is the one taking care of her for the back pain.  We can increase it now but she needs to see him---she may need pain management/

## 2011-03-06 ENCOUNTER — Other Ambulatory Visit: Payer: Self-pay | Admitting: Family Medicine

## 2011-03-06 DIAGNOSIS — M549 Dorsalgia, unspecified: Secondary | ICD-10-CM

## 2011-03-06 NOTE — Telephone Encounter (Signed)
patient needs refill methocarbamol 500mg  - patient will pick up [phone]

## 2011-03-06 NOTE — Telephone Encounter (Signed)
Last seen and filled 01/24/11   Please advise     KP

## 2011-03-07 MED ORDER — METHOCARBAMOL 500 MG PO TABS: 500.0000 mg | ORAL_TABLET | Freq: Four times a day (QID) | ORAL | Status: DC | PRN

## 2011-03-07 NOTE — Telephone Encounter (Signed)
Pt aware RX ready for pick up      KP

## 2011-03-07 NOTE — Telephone Encounter (Signed)
Left message to call back     KP 

## 2011-03-14 ENCOUNTER — Other Ambulatory Visit: Payer: Medicare HMO

## 2011-03-14 ENCOUNTER — Other Ambulatory Visit: Payer: Self-pay | Admitting: Family Medicine

## 2011-03-14 DIAGNOSIS — Z79899 Other long term (current) drug therapy: Secondary | ICD-10-CM

## 2011-03-14 DIAGNOSIS — E7800 Pure hypercholesterolemia, unspecified: Secondary | ICD-10-CM

## 2011-03-15 ENCOUNTER — Other Ambulatory Visit (INDEPENDENT_AMBULATORY_CARE_PROVIDER_SITE_OTHER): Payer: Medicare HMO | Admitting: Family Medicine

## 2011-03-15 ENCOUNTER — Ambulatory Visit (INDEPENDENT_AMBULATORY_CARE_PROVIDER_SITE_OTHER)
Admission: RE | Admit: 2011-03-15 | Discharge: 2011-03-15 | Disposition: A | Discharge status: Home / Self Care | Payer: Medicare HMO | Source: Ambulatory Visit | Attending: Family Medicine | Admitting: Family Medicine

## 2011-03-15 ENCOUNTER — Other Ambulatory Visit (INDEPENDENT_AMBULATORY_CARE_PROVIDER_SITE_OTHER): Payer: Medicare HMO

## 2011-03-15 DIAGNOSIS — J189 Pneumonia, unspecified organism: Secondary | ICD-10-CM

## 2011-03-15 DIAGNOSIS — Z13220 Encounter for screening for lipoid disorders: Secondary | ICD-10-CM

## 2011-03-15 DIAGNOSIS — Z79899 Other long term (current) drug therapy: Secondary | ICD-10-CM

## 2011-03-15 DIAGNOSIS — E7800 Pure hypercholesterolemia, unspecified: Secondary | ICD-10-CM

## 2011-03-15 LAB — HEPATIC FUNCTION PANEL
ALT: 15 U/L (ref 0–35)
Albumin: 2.4 g/dL — ABNORMAL LOW (ref 3.5–5.2)
Bilirubin, Direct: 0.2 mg/dL (ref 0.0–0.3)
Total Protein: 6.4 g/dL (ref 6.0–8.3)

## 2011-03-15 LAB — LIPID PANEL
Cholesterol: 205 mg/dL — ABNORMAL HIGH (ref 0–200)
HDL: 32 mg/dL — ABNORMAL LOW (ref >39.00)
Triglycerides: 217 mg/dL — ABNORMAL HIGH (ref 0.0–149.0)
VLDL: 43.4 mg/dL — ABNORMAL HIGH (ref 0.0–40.0)

## 2011-03-15 LAB — LDL CHOLESTEROL, DIRECT: Direct LDL: 129.1 mg/dL

## 2011-03-17 ENCOUNTER — Emergency Department (HOSPITAL_COMMUNITY): Payer: Medicare HMO

## 2011-03-17 ENCOUNTER — Inpatient Hospital Stay (HOSPITAL_COMMUNITY)
Admission: EM | Admit: 2011-03-17 | Discharge: 2011-04-08 | DRG: 539 | Disposition: A | Discharge status: Skilled Nursing Facility | Payer: Medicare HMO | Attending: Internal Medicine | Admitting: Internal Medicine

## 2011-03-17 DIAGNOSIS — K219 Gastro-esophageal reflux disease without esophagitis: Secondary | ICD-10-CM | POA: Diagnosis present

## 2011-03-17 DIAGNOSIS — E119 Type 2 diabetes mellitus without complications: Secondary | ICD-10-CM | POA: Diagnosis present

## 2011-03-17 DIAGNOSIS — J189 Pneumonia, unspecified organism: Secondary | ICD-10-CM | POA: Diagnosis present

## 2011-03-17 DIAGNOSIS — G929 Unspecified toxic encephalopathy: Secondary | ICD-10-CM | POA: Diagnosis present

## 2011-03-17 DIAGNOSIS — E785 Hyperlipidemia, unspecified: Secondary | ICD-10-CM | POA: Diagnosis present

## 2011-03-17 DIAGNOSIS — M519 Unspecified thoracic, thoracolumbar and lumbosacral intervertebral disc disorder: Secondary | ICD-10-CM | POA: Diagnosis present

## 2011-03-17 DIAGNOSIS — M4640 Discitis, unspecified, site unspecified: Secondary | ICD-10-CM | POA: Insufficient documentation

## 2011-03-17 DIAGNOSIS — K751 Phlebitis of portal vein: Secondary | ICD-10-CM | POA: Diagnosis present

## 2011-03-17 DIAGNOSIS — N179 Acute kidney failure, unspecified: Secondary | ICD-10-CM | POA: Diagnosis present

## 2011-03-17 DIAGNOSIS — K750 Abscess of liver: Secondary | ICD-10-CM | POA: Diagnosis present

## 2011-03-17 DIAGNOSIS — D72829 Elevated white blood cell count, unspecified: Secondary | ICD-10-CM | POA: Diagnosis present

## 2011-03-17 DIAGNOSIS — M869 Osteomyelitis, unspecified: Principal | ICD-10-CM | POA: Diagnosis present

## 2011-03-17 DIAGNOSIS — I519 Heart disease, unspecified: Secondary | ICD-10-CM | POA: Diagnosis present

## 2011-03-17 DIAGNOSIS — G459 Transient cerebral ischemic attack, unspecified: Secondary | ICD-10-CM | POA: Diagnosis present

## 2011-03-17 DIAGNOSIS — A419 Sepsis, unspecified organism: Secondary | ICD-10-CM | POA: Diagnosis present

## 2011-03-17 DIAGNOSIS — E876 Hypokalemia: Secondary | ICD-10-CM | POA: Diagnosis present

## 2011-03-17 DIAGNOSIS — D638 Anemia in other chronic diseases classified elsewhere: Secondary | ICD-10-CM | POA: Diagnosis present

## 2011-03-17 DIAGNOSIS — I10 Essential (primary) hypertension: Secondary | ICD-10-CM | POA: Diagnosis present

## 2011-03-17 DIAGNOSIS — G92 Toxic encephalopathy: Secondary | ICD-10-CM | POA: Diagnosis present

## 2011-03-17 HISTORY — DX: Disorder of kidney and ureter, unspecified: N28.9

## 2011-03-17 LAB — URINALYSIS, ROUTINE W REFLEX MICROSCOPIC
Hgb urine dipstick: NEGATIVE
Specific Gravity, Urine: 1.02 (ref 1.005–1.030)
Urobilinogen, UA: 1 mg/dL (ref 0.0–1.0)

## 2011-03-17 LAB — COMPREHENSIVE METABOLIC PANEL
Albumin: 2.3 g/dL — ABNORMAL LOW (ref 3.5–5.2)
BUN: 29 mg/dL — ABNORMAL HIGH (ref 6–23)
Chloride: 97 mEq/L (ref 96–112)
Creatinine, Ser: 1.68 mg/dL — ABNORMAL HIGH (ref 0.4–1.2)
Total Bilirubin: 1 mg/dL (ref 0.3–1.2)
Total Protein: 7.5 g/dL (ref 6.0–8.3)

## 2011-03-17 LAB — CBC
HCT: 31.2 % — ABNORMAL LOW (ref 36.0–46.0)
MCV: 87.6 fL (ref 78.0–100.0)
RBC: 3.56 MIL/uL — ABNORMAL LOW (ref 3.87–5.11)
WBC: 33.7 10*3/uL — ABNORMAL HIGH (ref 4.0–10.5)

## 2011-03-17 LAB — DIFFERENTIAL
Lymphocytes Relative: 2 % — ABNORMAL LOW (ref 12–46)
Lymphs Abs: 0.7 10*3/uL (ref 0.7–4.0)
Neutrophils Relative %: 96 % — ABNORMAL HIGH (ref 43–77)

## 2011-03-17 LAB — POCT I-STAT, CHEM 8
Chloride: 104 mEq/L (ref 96–112)
HCT: 33 % — ABNORMAL LOW (ref 36.0–46.0)
Potassium: 4.4 mEq/L (ref 3.5–5.1)

## 2011-03-17 LAB — URINE MICROSCOPIC-ADD ON

## 2011-03-17 LAB — CK TOTAL AND CKMB (NOT AT ARMC)
CK, MB: 0.9 ng/mL (ref 0.3–4.0)
Relative Index: INVALID (ref 0.0–2.5)

## 2011-03-17 LAB — APTT: aPTT: 41 seconds — ABNORMAL HIGH (ref 24–37)

## 2011-03-17 LAB — TROPONIN I: Troponin I: <0.3 ng/mL (ref <0.30)

## 2011-03-18 ENCOUNTER — Inpatient Hospital Stay (HOSPITAL_COMMUNITY): Payer: Medicare HMO

## 2011-03-18 ENCOUNTER — Ambulatory Visit: Payer: Medicare HMO

## 2011-03-18 ENCOUNTER — Other Ambulatory Visit (HOSPITAL_COMMUNITY): Payer: Medicare HMO

## 2011-03-18 DIAGNOSIS — M8618 Other acute osteomyelitis, other site: Secondary | ICD-10-CM

## 2011-03-18 DIAGNOSIS — A419 Sepsis, unspecified organism: Secondary | ICD-10-CM

## 2011-03-18 DIAGNOSIS — R4182 Altered mental status, unspecified: Secondary | ICD-10-CM

## 2011-03-18 LAB — URINE CULTURE
Colony Count: NO GROWTH
Culture: NO GROWTH

## 2011-03-18 LAB — LIPID PANEL
Cholesterol: 122 mg/dL (ref 0–200)
HDL: 20 mg/dL — ABNORMAL LOW (ref >39)
LDL Cholesterol: 73 mg/dL (ref 0–99)
Total CHOL/HDL Ratio: 6.1 RATIO
Triglycerides: 147 mg/dL (ref <150)

## 2011-03-18 LAB — CBC
HCT: 25 % — ABNORMAL LOW (ref 36.0–46.0)
HCT: 25.5 % — ABNORMAL LOW (ref 36.0–46.0)
Hemoglobin: 8.4 g/dL — ABNORMAL LOW (ref 12.0–15.0)
MCH: 29.2 pg (ref 26.0–34.0)
MCHC: 33.6 g/dL (ref 30.0–36.0)
MCHC: 32.9 g/dL (ref 30.0–36.0)
MCV: 88.5 fL (ref 78.0–100.0)
RBC: 2.81 MIL/uL — ABNORMAL LOW (ref 3.87–5.11)
RDW: 14.4 % (ref 11.5–15.5)
WBC: 30.8 10*3/uL — ABNORMAL HIGH (ref 4.0–10.5)

## 2011-03-18 LAB — LACTIC ACID, PLASMA: Lactic Acid, Venous: 0.9 mmol/L (ref 0.5–2.2)

## 2011-03-18 LAB — COMPREHENSIVE METABOLIC PANEL
BUN: 28 mg/dL — ABNORMAL HIGH (ref 6–23)
CO2: 21 mEq/L (ref 19–32)
Calcium: 8.5 mg/dL (ref 8.4–10.5)
GFR calc non Af Amer: 37 mL/min — ABNORMAL LOW (ref >60)
Glucose, Bld: 105 mg/dL — ABNORMAL HIGH (ref 70–99)
Total Protein: 6.2 g/dL (ref 6.0–8.3)

## 2011-03-18 LAB — GLUCOSE, CAPILLARY: Glucose-Capillary: 103 mg/dL — ABNORMAL HIGH (ref 70–99)

## 2011-03-18 LAB — MAGNESIUM: Magnesium: 2 mg/dL (ref 1.5–2.5)

## 2011-03-18 MED ORDER — GADOBENATE DIMEGLUMINE 529 MG/ML IV SOLN
6.0000 mL | Freq: Once | INTRAVENOUS | Status: AC | PRN
  Administered 2011-03-18: 6 mL via INTRAVENOUS

## 2011-03-19 ENCOUNTER — Inpatient Hospital Stay (HOSPITAL_COMMUNITY): Payer: Medicare HMO

## 2011-03-19 ENCOUNTER — Encounter (HOSPITAL_COMMUNITY): Payer: Self-pay | Admitting: Radiology

## 2011-03-19 DIAGNOSIS — N179 Acute kidney failure, unspecified: Secondary | ICD-10-CM

## 2011-03-19 LAB — LIPASE, BLOOD: Lipase: 14 U/L (ref 11–59)

## 2011-03-19 LAB — BASIC METABOLIC PANEL
CO2: 17 mEq/L — ABNORMAL LOW (ref 19–32)
Calcium: 8.2 mg/dL — ABNORMAL LOW (ref 8.4–10.5)
Creatinine, Ser: 1.2 mg/dL (ref 0.4–1.2)
GFR calc Af Amer: 53 mL/min — ABNORMAL LOW (ref >60)

## 2011-03-19 LAB — CBC
HCT: 25.3 % — ABNORMAL LOW (ref 36.0–46.0)
Hemoglobin: 8.1 g/dL — ABNORMAL LOW (ref 12.0–15.0)
Hemoglobin: 8.4 g/dL — ABNORMAL LOW (ref 12.0–15.0)
MCH: 29.5 pg (ref 26.0–34.0)
MCH: 29.7 pg (ref 26.0–34.0)
MCHC: 33.2 g/dL (ref 30.0–36.0)
MCV: 89.4 fL (ref 78.0–100.0)
RBC: 2.83 MIL/uL — ABNORMAL LOW (ref 3.87–5.11)

## 2011-03-19 LAB — GLUCOSE, CAPILLARY
Glucose-Capillary: 92 mg/dL (ref 70–99)
Glucose-Capillary: 118 mg/dL — ABNORMAL HIGH (ref 70–99)
Glucose-Capillary: 134 mg/dL — ABNORMAL HIGH (ref 70–99)
Glucose-Capillary: 126 mg/dL — ABNORMAL HIGH (ref 70–99)

## 2011-03-19 LAB — DIFFERENTIAL
Basophils Relative: 0 % (ref 0–1)
Eosinophils Relative: 0 % (ref 0–5)
Lymphocytes Relative: 4 % — ABNORMAL LOW (ref 12–46)
Monocytes Relative: 5 % (ref 3–12)
Neutro Abs: 30.6 10*3/uL — ABNORMAL HIGH (ref 1.7–7.7)

## 2011-03-19 LAB — HEPATIC FUNCTION PANEL
ALT: 12 U/L (ref 0–35)
Alkaline Phosphatase: 91 U/L (ref 39–117)
Bilirubin, Direct: 0.2 mg/dL (ref 0.0–0.3)
Indirect Bilirubin: 0.1 mg/dL — ABNORMAL LOW (ref 0.3–0.9)

## 2011-03-19 MED ORDER — IOHEXOL 300 MG/ML  SOLN
100.0000 mL | Freq: Once | INTRAMUSCULAR | Status: AC | PRN
  Administered 2011-03-19: 100 mL via INTRAVENOUS

## 2011-03-19 NOTE — Discharge Summary (Signed)
[name], [name]                    ACCOUNT NO.:  [id number]   MEDICAL RECORD NO.:  [id number]          PATIENT TYPE:  INP   LOCATION:  3728                         FACILITY:  [facility]   PHYSICIAN:  Valerie A. Felicity Coyer, MDDATE OF BIRTH:  [birth year]   DATE OF ADMISSION:  08/09/2007  DATE OF DISCHARGE:  08/12/2007                               DISCHARGE SUMMARY   DISCHARGE DIAGNOSES:  1. Acute gastrointestinal bleed with acute blood loss anemia.  2. Hypotension, likely secondary to shock in the setting of acute      blood loss anemia.   HISTORY OF PRESENT ILLNESS:  [name] is a 76 year old female who was  admitted on August 09, 2007, with chief complaint of feeling hot tired.  She apparently started to vomit around midnight the evening of admission  and noted black material.  She then noted black stools.  She had  previously been on omeprazole, but apparently had stopped it due to  financial constraints.  The patient is admitted for further evaluation  and treatment.   PAST MEDICAL HISTORY:  1. Ulcers.  2. Hypertension.  3. Diabetes type 2.  4. Dyslipidemia.  5. Peritonitis.  6. History of esophageal stricture.   HOSPITAL COURSE:  The patient was admitted and was seen in consultation  by [facility].  She underwent an endoscopy performed on August 09, 2007,  which was negative with the exception of noted coffee-grounds.  It was  recommended that the patient be continued on proton pump inhibitor.  She  has remained hemodynamically stable post transfusion.  She did require a  total of 3 units of packed red blood cells during this admission.  Hemoglobin at time of discharge is 9.8.  We have held her  antihypertensive medications and will continue to do so at time of  discharge.  This patient's blood pressure is 122/64.  We will defer  resuming these medications to the patient's primary care.  In addition,  the patient has been instructed to hold Fosamax at this time and will  defer  resuming this medication to the patient's primary care physician,  although there was no clear indication of esophagitis noted on EGD.  The  patient will need a followup hemoglobin and hematocrit at her  appointment with Dr. Laury Axon next week.   DISCHARGE MEDICATIONS:  1. Astelin spray 1-2 sprays each nostril daily.  2. Advair 250/50 1 puff twice daily.  3. Prilosec OTC 20 mg p.o. daily.  4. Tramadol 50 mg p.o. q.6 h. as needed.  5. Glimepiride 2 mg p.o. daily.   SPECIAL INSTRUCTIONS:  The patient is instructed to hold indapamide,  Benicar and atenolol.   FOLLOW UP:  She is instructed to follow up with Dr. Loreen Freud on  Tuesday, October 14, at 11:15 a.m.  She has been instructed to return to  the emergency room should she develop worsening weakness, recurrence of  black tarry stools or coffee-grounds emesis.      Sandford Craze, NP      Raenette Rover. Felicity Coyer, MD  Electronically Signed    MO/MEDQ  D:  08/12/2007  T:  08/13/2007  Job:  [id number]   cc:   Lelon Perla, DO

## 2011-03-19 NOTE — Assessment & Plan Note (Signed)
[facility]                               LIPID CLINIC NOTE   NAME:Bible, Luzmaria                             MRN:          [id number]  DATE:03/07/2008                            DOB:          [birth year]    Ms. Carrol returns to clinic for a follow-up of her liver and lipid panel.  For some reason during the lab draws, the liver panel was run but the  lipid panel was not run and when we called over to the lab, they did not  have the blood sample still and they were unable to run the test.  So  that visit was conducted without the assistance of labs but we got labs  drawn on Monday when Ms. Bossard was here and have them now as we are  dictating a few days later.  Ms. Longie reports that she is doing well.  She stated that she was tolerating her cholesterol therapy, which  included Tricor and Crestor every 4 days.  However, she ran out of  Tricor and also ran out of Crestor and has not been taking those  medications for the past 3 weeks.  She did not call us to let us know  that she had run out and as far as I can tell made no attempt to try to  get a refill on these medications.  She stated that despite having  myalgias in the past with statin agents, she did tolerate Crestor 5 mg  on an every-4-day basis and did not have any muscle aches.  She stated  she did try to go up to every other day on her Crestor but that this did  cause muscle aches this past time.  She reported no complaints with  taking her Tricor.  Diet was reviewed and a 24-hour recall reveals  breakfast with rice cakes and 1-1/2 tablespoons of organic peanut  butter, an orange and iced tea which was artificially sweetened.  Lunch  consisted of seven romaine leaves, two chicken wings, whole wheat flat  bread with flax, a chocolate chip cookie and diet Sprite, and she states  that she did not have dinner yesterday.  She does not drink milk and  when asked how she prepares her meats, she says that she  normally either  bakes items or grills them and rarely fries items.  In terms of  exercise, she does have problems with chronic pain and is not exercising  much.  She states that she has been doing some leg raises and some light  weight lifting with 1-pound weights in her hands and arms.  She states  she does this when she thinks about it, which can be anywhere from  multiple times a day to every 3 days.   PAST MEDICAL HISTORY:  Pertinent for diabetes, chronic back and joint  pain, and hyperlipidemia.   CURRENT MEDICATIONS:  Reviewed today and include:   1. Benicar 20 mg daily.  2. Atenolol 100 mg daily.  3. Advair 250/50 mcg twice daily.  4. Omeprazole 20 mg  daily.  5. Multivitamin daily.  6. Calcium with vitamin D daily.  7. Vitamin D tablet, the patient was unsure of dosage, 1/2 tablet      daily.  8. Vitamin E tablet daily.  9. Fish oil 1000 mg three times daily.  10.Flaxseed oil capsules daily.  11.A vitamin C supplement twice daily.  12.Indapamide 1.25 mg daily.  13.Amaryl 2 mg daily.  14.Astelin nasal spray 2 sprays in each nostril twice daily.  15.Crestor 5 mg every 4 days.  16.Tricor 45 mg daily.  17.She also takes albuterol metered-dose inhaler p.r.n., Benadryl      p.r.n. and tramadol 50 mg once daily to twice daily as needed on.   PHYSICAL EXAM:  We were unable to obtain a weight but blood pressure was  130/68 and heart rate of 64.   Labs which were drawn on May 4 showed a total cholesterol of 320,  triglycerides of 268, HDL of 37.9, LDL of 180.8, and LFTs were within  normal limits.   ASSESSMENT:  Currently Ms. Sermeno is not meeting any of her lipid goals.  Total cholesterol is greater than 200, triglycerides are greater than  150, HDL is less than 40, this should be greater than 40, and LDL is not  at goal of less than 100.  I suspect that this is due to medication  noncompliance as she has not taken any of her cholesterol therapy for  the past 3 weeks.  Her  diet seems to be within normal limits.  She does  not seem to be snacking on much.  I think the main limitation with Ms.  Ezra in terms of lifestyle modification is her inability to exercise due  to chronic pain.  She does walk and ambulate with the assistance of a  walker.   PLAN:  Medications:  We gave the patient samples of Crestor 5 mg and  instructed her to take this 1 tablet or 5 mg every 4 days as tolerated.  We instructed Ms. Beel that as she tolerates an every 4-day regimen, to  try to bump up to every 3-day regimen.  If myalgias persist or if the  pain is too great, she could to drop down to every 4-day regimen.  We  gave her 35 tablets of Crestor 5 mg, which even on an every 3-day  schedule should be sufficient for a 61-month follow-up.  We also gave the  patient a prescription for fenofibrate 54 mg 1 tablet p.o. daily.  The  patient was worried as she does have insurance now but she wanted to  have a generic, so upon conversation with the [facility] pharmacist, this  is the lowest dose of a generic fenofibrate that they carry at that  location.  We also instructed Ms. Cogle that if for some reason she was to  run out of her medication or if she was unable to get refills for  whatever reason, to please call our lipid clinic and to not come off of  her cholesterol medications.   In terms of diet, continue a heart-healthy diet.  We talked about  portion sizes and increasing vegetables within her diet.   In terms of exercise. I encouraged Ms. Diesing to do as much exercise as she  could tolerate, including lifting hand weights multiple times during the  day and any sort of physical activity or movement that she could do.   We will follow up with Ms. Skiver in three months' time to see  if her lipid  panel does improve, which I assume it will because she has not been on  cholesterol therapy for the last 3 weeks.  We will evaluate her at this  time and we will make plans at the further  visit.   Gerre Pebbles, PharmD dictating for Shelby Dubin, PharmD      Shelby Dubin, PharmD, BCPS, CPP  Electronically Signed      Rollene Rotunda, MD, [facility]  Electronically Signed   MP/MedQ  DD: 03/10/2008  DT: 03/10/2008  Job #: [id number]

## 2011-03-19 NOTE — Assessment & Plan Note (Signed)
[facility]                               LIPID CLINIC NOTE   NAME:Williams, Lori                             MRN:          [id number]  DATE:01/30/2009                            DOB:          1935/07/01    The patient was seen in Lipid Clinic for further evaluation and  medication titration associated with hyperlipidemia in the setting of  documented risk factors and diabetes.  The patient has been taking her  Vytorin 10/20 every 3 days until she ran out of it on January 09, 2009.  She continued on fenofibrate 54 mg daily and fish oil 1000 mg 3 times  daily.  She has been eating mostly vegetables, romaine lettuces,  Brussels sprouts, green beans.  Beans or peanut, butter are her primary  protein source, chicken and salmon are available and frequently selected  as well.  She is not getting much exercise, though she did just get a  treadmill and she is going to begin walking 5-10 minutes, 3 times a  week.  She does 20 leg raises each day and sometimes puts 1 pound weight  on her legs.   PAST MEDICAL HISTORY:  Pertinent for diabetes, hyperlipidemia,  medication intolerance.   PHYSICAL EXAMINATION:  Weight is 156 pounds, blood pressure 142/84, and  heart rate 60.   LABORATORY DATA:  On January 30, 2009, reveals total cholesterol 284,  triglycerides 207, HDL 50.2, LDL 181.  LFTs are within normal limits.   ASSESSMENT:  The patient's HDL has improved since Vytorin has been on  board but her LDL is still not at goal.  The patient has agreed to  increase Vytorin 10/40 one tablet every 3 days and will continue her  fenofibrate and her fish oil.  She will continue with fresh or frozen  fruits and veggies and we will try to increase her intake of whole wheat  and overall reduce her consumption of starches and bread.  She will work  towards her treadmill activity 3 times a week and call if muscle aches  or pain occurs.  We will see her back in 12 weeks for labs and  a  followup visit.  The patient was seen with Aquilla Hacker,  Pharm.D. candidate.      Shelby Dubin, PharmD, BCPS, CPP  Electronically Signed      Rollene Rotunda, MD, [facility]  Electronically Signed   MP/MedQ  DD: 01/31/2009  DT: 02/01/2009  Job #: [id number]   cc:   Lelon Perla, DO

## 2011-03-19 NOTE — Assessment & Plan Note (Signed)
[facility]                               LIPID CLINIC NOTE   NAME:Williams, Lori                             MRN:          [id number]  DATE:09/19/2008                            DOB:          April 26, 1935    [name] continues to follow up on her lipids due to hyperlipidemia in  the setting of multiple risk factors.  She tried Niaspan before, but  when she picked up the prescription she developed itching.  She has to  stop her Crestor due to mobilizing pain even a half of 5 mg tablet every  3-4 days.  She has been taking TriCor with her first meals a day at 45  mg daily and fish oil 1000 mg 3 times daily.  She has been eating lots  of whole grains and not on as much meat as previously.  She has had  diarrhea with some vegetables, although she has tried Beano.  She has  continued lower extremity pains that she gets very low exercise.   PHYSICAL EXAMINATION:  VITAL SIGNS:  Today weight is 150 and quarter  pounds, blood pressure is 168/82, heart rate is 76.   LABORATORY DATA:  Labs on September 14, 2008, revealed total cholesterol  280, triglycerides 190, HDL 40.2, and LDL 169.9.  LFTs within normal  limits.   ASSESSMENT:  The patient has poor control of her cholesterol, but  options are running out.  She has discontinued Crestor because she could  not tolerate it.   PLAN:  The patient will try Vytorin 10/20 every third day.  We gave her  samples.  I have advised her to continue her fish oil and her low-dose  fenofibrate.  She will continue to eat her increased fiber and whole  grain diet and worked to [employer] and fresh or frozen foods  instead of canned.  She will continue to discuss pain control with her  physician and hopes that we can get to the better exercise regimen and  she will follow up with this in 3 months.  She will call with questions  or problems in the meantime.      Shelby Dubin, PharmD, BCPS, CPP  Electronically  Signed      Rollene Rotunda, MD, [facility]  Electronically Signed   MP/MedQ  DD: 11/28/2008  DT: 11/29/2008  Job #: [id number]

## 2011-03-19 NOTE — Assessment & Plan Note (Signed)
[facility]                               LIPID CLINIC NOTE   NAME:[name], [name]                             MRN:          [id number]  DATE:10/22/2007                            DOB:          1935/02/24    Ms. Manseau comes in today for her initial visit with [place] here in the lipid  clinic.  She was referred to [place] by her primary care physician, Dr.  Laury Axon.   PAST MEDICAL HISTORY:  1. Type 2 diabetes.  2. Hypertension.  3. Hyperlipidemia.  4. History of a GI bleed.  5. Chronic kidney disease.  6. Asthma.   CURRENT MEDICATIONS:  Benicar, atenolol, Advair, omeprazole,  multivitamin, calcium, vitamin D, vitamin E, fish oil, flaxseed oil  capsules, vitamin C, indapamide, glimepiride, albuterol p.r.n., Benadryl  p.r.n., and tramadol p.r.n.   ALLERGIES:  PENICILLIN, CODEINE, ACE INHIBITORS.   SOCIAL HISTORY:  She does not use tobacco products or drink alcoholic  beverages.  She adheres to a low-carbohydrate diet and eats very little  red meat.  She tries to eat low-carb and low-fat items in line with her  hyperlipidemia and diabetes.  She does not exercise much.  She gets  around with the help of a walker.   PHYSICAL EXAM:  Weight is 151 pounds, blood pressure is 148/80, heart  rate is 54.   LABORATORY DATA:  From November 2008, triglycerides of 416, HDL of 38,  LDL was not able to be calculated.  Liver function tests are within  normal limits.   ASSESSMENT:  Ms. Eno triglycerides are elevated.  HDL is not quite a  goal.  In the past by her report she has been intolerant to multiple  statins and Niaspan.  She is not sure of all the statins she has been  tried on, although she does remember Lipitor and Pravachol.  She was  recently given simples of fenofibrate products by Dr. Laury Axon but has yet  to begin taking this as she saw a warning in the package insert against  taking that medication if you have chronic kidney disease.  Her most  recent serum  creatinine is 1.5 with a calculated creatinine clearance  being around 36.  We talked about her elevated triglycerides being the  primary focus right now.  She does have diabetes.  I questioned her  about her blood glucoses recently, and she keeps a good record of those,  and her morning fasting glucoses have been between 75-110, which she  says has been stable for quite some time.  I agreed with her that  fenofibrate needs to be renally-adjusted to avoid accumulation.   PLAN:  I am going to start with fenofibrate 48 mg daily and samples were  given to her.  This is an appropriate dose for her level of renal  function.  When these samples are complete and if she is tolerating it  just fine, I asked her to take the samples Dr. Laury Axon had given her but  take one-half tablet daily.  This will be a dose  of 60 mg, which also  should be tolerable with her level of renal function.  I gave her some  information on a heart-healthy diet as well as some information on  mechanisms of different cholesterol medicines and the nature of  hyperlipidemia, which she seemed very interested in reading and learning  about.  We are going to follow up with her with a repeat lipid and liver  panel in 6 weeks.  She was instructed to call us with any concerns or  questions in the meantime.      Charolotte Eke, PharmD  Electronically Signed      Rollene Rotunda, MD, [facility]  Electronically Signed   TP/MedQ  DD: 10/22/2007  DT: 10/23/2007  Job #: [id number]   cc:   Lelon Perla, DO

## 2011-03-19 NOTE — Consult Note (Signed)
[name], [name] NO.:  [id number]  MEDICAL RECORD NO.:  [id number]           PATIENT TYPE:  E  LOCATION:  [facility]                         FACILITY:  [facility]  PHYSICIAN:  Thana Farr, MD    DATE OF BIRTH:  01-02-1935  DATE OF CONSULTATION:  03/17/2011 DATE OF DISCHARGE:                                CONSULTATION   HISTORY:  [name] is a 76 year old female that was picnicking with her family today.  She went to the bathroom and was noted to have slurred speech and left-sided weakness.  Last seen normal time was estimated at approximately 5:15.  EMS was called.  On their evaluation, the patient was unable to move the left side.  Code stroke was called and the patient was brought in for evaluation.  Initial NIH stroke scale was 4.  The patient complains of low back pain.  She has had chronic issues with pain.  Reports that her pain has been very severe since last evening. Her pain has not been responsive to her usual pain medications.  PAST MEDICAL HISTORY: 1. Diabetes. 2. Back pain status post surgical intervention. 3. Osteoarthritis. 4 . Hypertension. 1. Breast cancer. 2. Hypercholesterolemia. 3. Asthma.  MEDICATIONS:  Losartan, oxycodone, glimepiride, Propoxy-N, tramadol, indapamide, fenofibrate, Ambien, trazodone, omeprazole, and Vytorin.  ALLERGIES:  CODEINE and PENICILLIN.  SOCIAL HISTORY:  The patient quit smoking approximately 11 years ago. She has no history of alcohol or illicit drug abuse.  She is divorced.  PHYSICAL EXAMINATION:  VITAL SIGNS:  Blood pressure 119/96, heart rate 100, respiratory rate 18, and O2 sat 98% on 2 L nasal cannula. NEUROLOGIC:  On mental status testing, the patient is alert.  She focuses on her pain and must be reoriented to the conversation multiple times.  Speech is fluent.  She can follow commands.  On cranial nerve testing II, visual fields 4, 3, 4, 6.  Extraocular movements intact.  V and VII, smile  symmetric.  VIII, grossly intact.  IX and VII, positive gag.  XI, bilateral shoulder shrug.  XII, midline tongue extension.  On motor exam, the patient has 5/5 strength in the bilateral upper extremities.  She is 4/5 with the left lower extremity and 3+/5 with the right lower extremity.  On sensory test, pinprick and light touch are intact bilaterally.  Deep tendon reflexes are 2+ in the bilateral upper extremities, trace at the left knee, and absent otherwise in the lower extremities.  Plantar is mute on the left and downgoing on the right. On cerebellar testing, finger-to-nose and heel-to-shin intact.  LABORATORY DATA:  Sodium of 135, potassium 4.4, chloride 104, bicarb 19, BUN and creatinine 31 and 2 respectively, and glucose 135.  CT shows no acute changes.  Cholesterol 205 and LDL is 129.  ASSESSMENT:  [name] is a 76 year old female that had initial complaints of left-sided weakness and slurred speech.  The patient does not recall the events of today but only reports her pain.  Her exam is likely at baseline at this point which would suggest that she had rapid clearing of her symptoms.  Because of this rapid clearing of symptoms, she is not considered a tPA candidate.  Cannot rule out the possibility of transient ischemic attack.  Cannot rule out possibility of seizure as well.  Also must consider the fact that the patient is quite focused on her pain and has not been responsive to her usual pain medications.  She may have increased her doses without doctor's approval.  Further workup is indicated.  PLAN: 1. MRI of the brain without contrast. 2. EEG. 3. Initiate statin therapy. 4. Echocardiogram, carotid Dopplers. 5. Would start on aspirin 81 mg p.o. daily.  Since TIA is in the     differential and the patient has multiple risk factors to include     hypercholesterolemia, diabetes, and hypertension, this will be     prophylactic for her.           ______________________________ Thana Farr, MD     LR/MEDQ  D:  03/17/2011  T:  03/18/2011  Job:  [id number]  Electronically Signed by Thana Farr MD on 03/19/2011 04:37:51 PM

## 2011-03-19 NOTE — Assessment & Plan Note (Signed)
[facility]                               LIPID CLINIC NOTE   NAME:Williams, Lori                             MRN:          [id number]  DATE:12/21/2007                            DOB:          09/29/1935    REFERRING PHYSICIAN:  Lelon Perla, DO   PRIMARY PHYSICIAN:  Dr. Laury Axon.   The patient is seen in Lipid Clinic for further evaluation and  medication titration associated with her hyperlipidemia.  She is seen  with her daughter today.  They state she had been doing well overall.  She is currently taking fenofibrate 160 mg daily, fish oil daily and  flaxseed oil daily.  Dietary review reveals a breakfast of peanut butter  on rice cakes with an orange, lunch is brown rice with lean chicken,  dinners include sliced cheese, whole grains.  Her primary protein source  is from chicken and cheese.  She does also eat beans.  She is trying to  work up to 3 meals a day and eat red meat only occasionally but usually  she only has two meals in a day.  The patient is able exercise very  little.  She does some leg raising and stretching, but due to her back  surgery and joint pain, she is extremely limited.  She has been  following her blood sugars.  Her low blood sugars are in the 58-62  range.  Many are in the 31s.  Her high that is noted in the book is 121.  The patient has been compliant with her drug therapy.   PAST MEDICAL HISTORY:  Pertinent for diabetes, chronic back and joint  pain, hyperlipidemia.   CURRENT MEDICATIONS:  Include fenofibrate, fish oil, flaxseed oil,  Benicar, atenolol, Advair, omeprazole, multivitamin, calcium with D,  vitamin E, fish oil, flaxseed, vitamin C, indapamide, glimepiride, and  Astelin.   PHYSICAL EXAM:  Blood pressure is 170/80 on recheck 142/78, heart rate  is 64.  Weight is 140 pounds.   Labs on December 17, 2007 revealed total cholesterol 255, triglyceride  162, HDL 47 and LDL 168.9.  LFTs were within normal  limits.   ASSESSMENT:  The patient does not meet goal for this time.  The patient  understands need to try again with the statin as back pain symptoms may  have clouded picture previously.  The patient will begin taking Crestor  5 mg every 3-4 days.  She will continue on Tricor at 40 mg dose.  We  have given her samples to get her through the third of next month.  As  she is does not have prescription coverage at this time.  She will  continue her fish oil, flaxseed oil and discontinue her vitamin E  supplement.  She will continue on her good diet and work to increase her  exercise.  She will follow up with a lipid and liver panel and 3 months  and follow-up clinic visit on May 4.  She will call with questions or  problems in the meantime.  We have encouraged the  patient to follow with  Dr. Laury Axon regarding her diabetes to make sure that maximal therapy is  obtained time.  Time spent with the patient is 40 minutes.  The  patient's visit was shared with Gerre Pebbles, pharmacy resident.      Shelby Dubin, PharmD, BCPS, CPP  Electronically Signed      Rollene Rotunda, MD, [facility]  Electronically Signed   MP/MedQ  DD: 01/05/2008  DT: 01/05/2008  Job #: [id number]   cc:   Lelon Perla, DO

## 2011-03-19 NOTE — Consult Note (Signed)
NAMECLARITZA, [name]                    ACCOUNT NO.:  [id number]   MEDICAL RECORD NO.:  [id number]          PATIENT TYPE:  INP   LOCATION:  1823                         FACILITY:  [facility]   PHYSICIAN:  Hedwig Morton. Juanda Chance, MD     DATE OF BIRTH:  23-May-1935   DATE OF CONSULTATION:  DATE OF DISCHARGE:                                 CONSULTATION   UPPER ENDOSCOPY:   INDICATIONS:  This is Williams 76 year old Asian woman, who presented to the  emergency room with Williams passage of melenic stool and extreme weakness.  Her hemoglobin was initially 11 grams, but after hydration dropped to 8  grams with her having Williams low systolic blood pressure in 60s.  She  responded to fluid challenge.  There was no bright red blood in the  rectum, but her BUN was elevated to 111 with Williams creatinine of 1.7.  She  has Williams history of Williams bleeding duodenal ulcer in 2006 with one endoscopy by  Dr. Arlyce Dice.  She also had Williams colonoscopy by Dr. Russella Dar in December 2007  with multiple colon polyps.  Patient has been on Fosamax, which she  discontinued.  There is also Williams history of esophageal stricture, which  was noted but not dilated.  Patient denies any dysphagia.   ENDOSCOPE:  Olympus Single Channel  video endoscope.   SEDATION:  Versed 5 mg.  IV Fentanyl 60 mcg.   FINDINGS:  Olympus  Single channel video endoscope ascended as usual  into the posterior pharynx into the esophagus.  Patient was monitored by  BiPAP suction, but her oxygen saturations were normal at 100% on 2  liters of nasal flow.  Patient was cooperative.  Proximal and distal  subcu mucosa was unremarkable.  There was no blood in the esophagus or  any erosions.  There was no stricture.  Diaphragmatic hiatus was  somewhat tortuous, but after careful examination there was no Mallory-  Weiss tear or any erosions.   Stomach:  The stomach was insufflated with air.  There was no hiatal  hernia.  Williams few specks of coffee ground material was noted in the  proximal stomach.  There were  normal rugal folds, normal gastric antrum,  with normal appearing pyloric outlet.  There were no AV malformations or  bleeding vessels.  Retroflexion of the endoscope revealed normal fundus  and cardia.  Fundus and cardia were irrigated with copious amounts of  water to clear the coffee ground material.  I could not appreciate  anything to account for gastrointestinal blood loss.   Duodenum:  The upper descending duodenum was normal.  No evidence of  duodenal ulcer or any deformity.  Endoscope was then retracted.  The  stomach decompressed.   IMPRESSION:  Essentially negative upper endoscopy of esophagus, stomach  duodenum with presence of Williams small amount of coffee ground material in  the stomach, status post presumed upper GI bleed.   PLAN:  Patient will most likely need small bowel capsular endoscopy and  careful observation.  If she has recurrent hematemesis and melena, I  will have  to re-endoscope her to look for small lesions.  It appears  that she might have  bled at least 48 hours ago, since the melena started at that time and it  is also possible that the lesions that initially bled had cleared up and  they are no longer endoscopically discernible.  Patient will be  continued to proton pump inhibitors.      Hedwig Morton. Juanda Chance, MD  Electronically Signed     DMB/MEDQ  D:  08/09/2007  T:  08/09/2007  Job:  [id number]   cc:   Venita Lick. Russella Dar, MD, Clementeen Graham  Melissa L. Ladona Ridgel, MD
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
## 2011-03-20 ENCOUNTER — Inpatient Hospital Stay (HOSPITAL_COMMUNITY): Payer: Medicare HMO

## 2011-03-20 LAB — GLUCOSE, CAPILLARY
Glucose-Capillary: 115 mg/dL — ABNORMAL HIGH (ref 70–99)
Glucose-Capillary: 122 mg/dL — ABNORMAL HIGH (ref 70–99)

## 2011-03-20 LAB — PHOSPHORUS: Phosphorus: 1.8 mg/dL — ABNORMAL LOW (ref 2.3–4.6)

## 2011-03-20 LAB — BASIC METABOLIC PANEL
GFR calc Af Amer: >60 mL/min (ref >60)
GFR calc non Af Amer: 50 mL/min — ABNORMAL LOW (ref >60)
Potassium: 3.8 mEq/L (ref 3.5–5.1)
Sodium: 134 mEq/L — ABNORMAL LOW (ref 135–145)

## 2011-03-20 LAB — CBC
MCV: 88.6 fL (ref 78.0–100.0)
Platelets: 389 10*3/uL (ref 150–400)
RBC: 3.06 MIL/uL — ABNORMAL LOW (ref 3.87–5.11)
WBC: 32.2 10*3/uL — ABNORMAL HIGH (ref 4.0–10.5)

## 2011-03-20 LAB — HEPARIN LEVEL (UNFRACTIONATED): Heparin Unfractionated: <0.1 IU/mL — ABNORMAL LOW (ref 0.30–0.70)

## 2011-03-20 LAB — MAGNESIUM: Magnesium: 2 mg/dL (ref 1.5–2.5)

## 2011-03-20 NOTE — Procedures (Signed)
REFERRING PHYSICIAN:  Thana Farr, MD.  HISTORY:  A 77 year old female with episode of slurred speech and left- sided weakness.  MEDICATIONS:  Robaxin, vitamin E, vitamin D3, vitamin C, indapamide, fish oil, calcium carbonate, Benicar, docusate, TriCor, hyoscyamine, atenolol, Vytorin, oxycodone, flaxseed oil, glimepiride, Advair.  CONDITIONS OF RECORDING:  This is a 16-channel EEG carried out with the patient in the awake and drowsy states.  DISCUSSION:  The waking background activity consists of a low-voltage, symmetrical, fairly well-organized 9 Hz alpha activity seen from the parieto-occipital and posterior temporal regions.  Low-voltage fast activity poorly organized was seen anteriorly and at times superimposed on more posterior rhythms.  A mixture of theta and alpha rhythm was seen from the central and temporal regions.  The patient drowses with slowing to irregular, the voltage is theta and beta activity.  Stage II sleep was not obtained.  Hypoventilation was not performed.  Intermittent photic stimulation failed to elicit any change in the tracing.  IMPRESSION:  This is a normal EEG.          ______________________________ Thana Farr, MD    EA:[id number] D:  03/18/2011 14:58:38  T:  03/19/2011 01:01:55  Job #:  [id number]

## 2011-03-20 NOTE — Consult Note (Signed)
[name], [name]                    ACCOUNT NO.:  [id number]  MEDICAL RECORD NO.:  [id number]           PATIENT TYPE:  I  LOCATION:  3109                         FACILITY:  [facility]  PHYSICIAN:  Wilmon Arms. Corliss Skains, M.D. DATE OF BIRTH:  04/13/1935  DATE OF CONSULTATION: DATE OF DISCHARGE:                                CONSULTATION   REFERRING PHYSICIAN:  Larina Earthly, MD  REASON FOR EVALUATION:  Hepatic abscess and portal venous thrombosis.  HISTORY OF PRESENT ILLNESS:  This is a 76 year old female with a recent history of worsening lumbar diskitis who is admitted on Mar 17, 2011, for altered mental status and slurred speech.  It was felt that she might have had a TIA as well as worsening sepsis.  She was admitted and placed on IV antibiotics.  Blood cultures have shown gram-positive cocci.  The patient continues to have climbing white blood cell count which was felt to be due to sources other than her diskitis.  She has had some worsening abdominal pain and had a CT scan of the abdomen and pelvis today.  The CT scan shows diffuse edema throughout the left lobe of the liver with a 2-cm fluid collection in the lateral left lobe of the liver consistent with cyst versus abscess.  The left portal vein is thrombosed.  The patient also has known diskitis at L3-4.  She also has some chronic changes at her pubic symphysis that may be due to chronic infection.  We are consulted now for surgical recommendations.  PAST MEDICAL HISTORY: 1. Diabetes type 2. 2. Hypertension. 3. Chronic low back pain. 4. Hyperlipidemia. 5. Osteoporosis.  PAST SURGICAL HISTORY: 1. C-section x2. 2. Exploratory laparotomy for peritonitis after one of her C-sections. 3. Appendectomy at the time of her exploratory laparotomy. 4. Lumbar surgery x2.  MEDICATIONS:  Please see her home med rec list for home medications. Currently, her antibiotics include Levaquin, Aztreonam, and vancomycin.  ALLERGIES:  PENICILLIN,  CODEINE.  SOCIAL HISTORY:  The patient quit smoking in 2001.  She does not drink alcohol.  FAMILY HISTORY:  Positive for alcohol and tobacco abuse in her mother, father is deceased from coronary artery disease.  REVIEW OF SYSTEMS:  Otherwise, negative.  PHYSICAL EXAMINATION:  VITAL SIGNS:  Temperature 98.7, pulse 90, blood pressure 141/59, sats 97% on 2 liters. GENERAL:  This is an elderly Asian female in no apparent distress. HEENT:  EOMI.  Sclerae icteric. NECK:  No mass or thyromegaly. LUNGS:  Clear.  Normal respiratory effort. HEART:  Regular rate and rhythm.  No murmur. ABDOMEN:  Occasional bowel sounds.  A well-healed lower midline and right paramedian vertical incisions.  She is tender in the right upper quadrant and epigastrium as well as in the lower midline.  No rebound or guarding.  Abdomen is fairly soft.  LABORATORIES:  White count 33.6, hemoglobin 8.4, platelet count 388. Electrolytes potassium is 3.4, bicarb is 17, BUN 29, creatinine 1.2. Her LFTs are within normal limits, lactate 0.8.  CT scan of the abdomen and pelvis as described above with left portal venous thrombosis and a 2-cm abscess  in the left lobe of the liver consistent with septic thrombophlebitis of the portal vein with edema of the left lobe of the liver, a diskitis and osteomyelitis at L3-4 and pubic symphysis abnormalities as previously described.  IMPRESSION: 1. Lumbar diskitis/osteomyelitis at L3-4. 2. Left portal venous thrombosis with left hepatic abscess likely     secondary to lumbar diskitis/osteomyelitis at L3-4 with resulting     bacteremia.  RECOMMENDATIONS:  The hepatic abscess is likely too small to be drained by Interventional Radiology.  Would continue IV antibiotics for now. Would anticoagulate the patient with heparin.  While she is having this degree of abdominal tenderness, would keep her n.p.o. with only ice chips.  We will continue to follow with you.     Wilmon Arms.  Corliss Skains, M.D.     MKT/MEDQ  D:  03/19/2011  T:  03/20/2011  Job:  [id number]  cc:   Larina Earthly, M.D.  Electronically Signed by Manus Rudd M.D. on 03/20/2011 08:00:20 AM

## 2011-03-21 LAB — CULTURE, BLOOD (ROUTINE X 2)

## 2011-03-21 LAB — MAGNESIUM: Magnesium: 1.8 mg/dL (ref 1.5–2.5)

## 2011-03-21 LAB — GLUCOSE, CAPILLARY
Glucose-Capillary: 137 mg/dL — ABNORMAL HIGH (ref 70–99)
Glucose-Capillary: 173 mg/dL — ABNORMAL HIGH (ref 70–99)
Glucose-Capillary: 113 mg/dL — ABNORMAL HIGH (ref 70–99)

## 2011-03-21 LAB — BASIC METABOLIC PANEL
Chloride: 105 mEq/L (ref 96–112)
Creatinine, Ser: 0.98 mg/dL (ref 0.4–1.2)
GFR calc Af Amer: >60 mL/min (ref >60)
GFR calc non Af Amer: 56 mL/min — ABNORMAL LOW (ref >60)
Potassium: 3.3 mEq/L — ABNORMAL LOW (ref 3.5–5.1)

## 2011-03-21 LAB — HEPARIN LEVEL (UNFRACTIONATED): Heparin Unfractionated: 0.13 IU/mL — ABNORMAL LOW (ref 0.30–0.70)

## 2011-03-21 LAB — CBC
Hemoglobin: 8 g/dL — ABNORMAL LOW (ref 12.0–15.0)
RBC: 2.76 MIL/uL — ABNORMAL LOW (ref 3.87–5.11)

## 2011-03-21 LAB — PHOSPHORUS: Phosphorus: 2.6 mg/dL (ref 2.3–4.6)

## 2011-03-21 NOTE — H&P (Signed)
  NAMESHAKERA, EBRAHIMI                    ACCOUNT NO.:  [id number]  MEDICAL RECORD NO.:  [id number]           PATIENT TYPE:  LOCATION:                                 FACILITY:  PHYSICIAN:  Eduard Clos, MDDATE OF BIRTH:  May 28, 1935  DATE OF ADMISSION: DATE OF DISCHARGE:                             HISTORY & PHYSICAL   ADDENDUM  At this time, I am not placing the patient on any antiplatelet agents although there is a possibility of TIA as the patient has possibility of osteomyelitis and may need aspiration.     Eduard Clos, MD     ANK/MEDQ  D:  03/18/2011  T:  03/18/2011  Job:  [id number]  Electronically Signed by Midge Minium MD on 03/21/2011 07:20:33 AM

## 2011-03-21 NOTE — H&P (Signed)
[name], [name]                    ACCOUNT NO.:  [id number]  MEDICAL RECORD NO.:  [id number]           PATIENT TYPE:  I  LOCATION:  3109                         FACILITY:  [facility]  PHYSICIAN:  Eduard Clos, MDDATE OF BIRTH:  January 14, 1935  DATE OF ADMISSION:  03/17/2011 DATE OF DISCHARGE:                             HISTORY & PHYSICAL   PRIMARY CARE PHYSICIAN:  Lelon Perla, DO  CHIEF COMPLAINT:  Slurred speech and altered mental status.  HISTORY OF PRESENT ILLNESS:  A 76 year old female with known history of hypertension, diabetes mellitus type 2, asthma, chronic low back pain; was brought in to the ER after the patient was found to be confused and had a slurred speech as per the patient's daughter who provided the history.  The patient had gone to the bathroom around 2 p.m. and was not coming out for almost 45 minutes, and they forced themselves into the bathroom and found the patient was on the commode, sitting and very drowsy and slanting to her left.  At the time when they woke her up, she was speaking in a slurred speech and confused.  They called the EMS. Initially, EMS felt that she had some weakness in the left side and was brought to the ER on code stroke.  CT head was negative.  Neurologist evaluated the patient.  By the time the patient had reached ER, the patient had become very alert, awake, and there was no focal deficit. At this time, neurologist had recommended a TIA workup.  The admission labs shows a WBC count of 33,000 and the patient was initially hypotensive and responded to fluids.  X-ray of the low back was done because of the patient's persistent low back pain which shows possibility of progressive diskitis and L3-L4 osteomyelitis.  The patient has been admitted for further workup for her sepsis and also possible progressive diskitis and osteomyelitis of L3-L4.  The patient stated that she is not feeling well for last 2-3 days.  She has not been  eating well.  Appetite has decreased.  Denies any chest pain.  The family noticed some cough.  When initially they opened the bathroom, the patient was coughing with productive sputum.  Otherwise, she did not have cough previously.  No fever, chills.  No nausea, vomiting.  Denies any dysuria, discharge, or diarrhea.  The patient in fact has been having constipation, though daughter states that she did move her bowels today.  She also noticed some jerky movements initially when they opened the door.  At this time, the patient is alert, awake, oriented x3.  Was moving upper and lower extremities.  Has no chest pain or shortness of breath.  PAST MEDICAL HISTORY: 1. History of diabetes mellitus type 2. 2. Hypertension. 3. Chronic low back pain. 4. Hyperlipidemia. 5. Osteoporosis. 6. Has had low back surgery twice. 7. Appendectomy. 8. Cesarean section x2.  MEDICATIONS ON ADMISSION: 1. Biaxin 500 mg p.o. q.6 hourly. 2. Vitamin E. 3. Vitamin D3. 4. Vitamin C. 5. Multivitamin. 6. Indapamide 1.25 mg p.o. daily. 7. Fish oil. 8. Calcium carbonate. 9. Benicar 20  mg daily. 10.Docusate. 11.TriCor 54 mg daily. 12.Hyoscyamine. 13.Atenolol 100 mg p.o. daily. 14.Vytorin 10/20 mg p.o. daily. 15.Oxycodone 5 mg immediate release tablet p.o. 3 times daily before     meals. 16.Glimepiride 2 mg half tablet twice daily before meals. 17.Advair Diskus 250/50 one puff twice daily.  ALLERGIES:  PENICILLIN, CODEINE.  FAMILY HISTORY:  Mother was an alcoholic and has had arthritis and used to smoke.  Father died in his 40s, had bypass surgery and he was a nonsmoker.  SOCIAL HISTORY:  The patient quit smoking around 11 years ago.  Denies any alcohol or drug abuse.  Full code.  REVIEW OF SYSTEMS:  As per history of presenting illness.  Nothing else significant.  PHYSICAL EXAMINATION:  GENERAL:  The patient examined at bedside, not in acute distress. VITAL SIGNS:  Blood pressure is 100/60, pulse  is 80 per minute, temperature 98.2, respirations 18 per minute, O2 sat 100%.  HEENT: Anicteric.  No pallor.  No discharge from ears, eyes, nose, or mouth. CHEST:  Bilateral air entry present.  No rhonchi, no crepitation. HEART:  S1, S2 heard. ABDOMEN:  Soft, nontender.  Bowel sounds heard. CNS:  The patient alert, awake, and oriented to time, place, and person. Moves upper and lower extremities 5/5.  There is no pronator drift.  No dysdiadochokinesia or ataxia. EXTREMITIES:  Peripheral pulses felt.  No acute ischemic changes, cyanosis, or clubbing.  LABORATORY DATA:  EKG shows sinus tachycardia and beats around 103 beats per minute with poor R-wave progression and nonspecific ST-T changes. CT of the head without contrast shows no acute intracranial findings. Chest x-ray shows lingular densities, similar to prior studies, and may be due to treated pneumonia and scarring or possibly epicardial fat.  No acute pneumonia is identified.  X-ray of the lumbosacral spine, findings compatible with progressive diskitis and osteomyelitis L3-L4.  CBC; WBC is 33.7, hemoglobin 11.2, hematocrit 33, platelets 448.  PT/INR is 16.5 and 1.3.  Complete metabolic panel; sodium 135, potassium 4.4, chloride 104, carbon dioxide 20, anion gap is 11, glucose 135, BUN 31, creatinine 2.  Last creatinine was 1.2, total bilirubin was 1, alk phos 126, AST 27, ALT 13, total protein 7.5, albumin 2.3, procalcitonin level is 90.22, lactic acid was 0.9, CK is 68, MB is 0.9, troponin less than 0.3. UA is turbid in color, nitrites negative, small leukocytes, wbc's 3-6, squamous cells many, bacteria few, small __________ urates and phosphates.  Urine and blood cultures are pending.  CT of the head without contrast shows no acute intracranial findings.  ASSESSMENT: 1. Sepsis, source not clearly known. 2. Possible osteomyelitis of the L3-L4 and possible progressive     diskitis. 3. Acute renal failure. 4. Possible  transient ischemic attack. 5. Diabetes mellitus type 2. 6. Anemia. 7. Osteoporosis. 8. History of hypertension, presently hypotensive.  PLAN: 1. At this time, we will admit the patient to step-down unit. 2. For her sepsis, at this time the patient will be placed on broad-     spectrum antibiotics which will cover for possible osteomyelitis     and also for any pneumonia.  At this time, I am placing the patient     on vancomycin, Levaquin.  We will also add Azactam at this time as     the patient is allergic to PENICILLIN.  I will be getting blood     cultures, urine cultures, repeat lactic acid in a.m. along with     repeat portable chest x-ray in the a.m.  We are going to     aggressively hydrate the patient.  At this time, I did discuss with     __________ fellow as the patient's blood pressures are responding     to fluids and also lactic acid is normal, code  sepsis has not been     initiated. 3. For her possible osteomyelitis and progressive diskitis, I did     discuss with on-call doctor for Dr. Jordan Likes, Dr. Franky Macho who advised     at this time to get MRI of the lumbosacral spine to confirm if     there is any apparent osteomyelitis.  If there is osteomyelitis,     then we will get aspiration through interventional radiologist and     if there is still need for need for neurosurgeon, to consult them. 4. Possible TIA.  At this time, we will be initiating a TIA workup     including MRI of the brain, MRA of the brain. 5. Acute renal failure.  We will aggressively hydrate the patient.     Check intake and output.  If the creatinine does not improve with     these measures, we will need to do a renal sonogram. 6. We will be repeating CMET, CBC, lactic acid in a.m., and we will be     holding off antihypertensive as the patient is mildly hypotensive     but responding to fluids.  The patient is a full code. 7. Further recommendation based on his tests ordered and clinical      course.     Eduard Clos, MD     ANK/MEDQ  D:  03/18/2011  T:  03/18/2011  Job:  [id number]  cc:   Lelon Perla, DO  Electronically Signed by Midge Minium MD on 03/21/2011 07:20:29 AM

## 2011-03-22 DIAGNOSIS — K750 Abscess of liver: Secondary | ICD-10-CM

## 2011-03-22 DIAGNOSIS — M519 Unspecified thoracic, thoracolumbar and lumbosacral intervertebral disc disorder: Secondary | ICD-10-CM

## 2011-03-22 LAB — CBC
MCH: 29.4 pg (ref 26.0–34.0)
MCHC: 34 g/dL (ref 30.0–36.0)
MCV: 86.4 fL (ref 78.0–100.0)
Platelets: 367 10*3/uL (ref 150–400)
RDW: 14.4 % (ref 11.5–15.5)

## 2011-03-22 LAB — BASIC METABOLIC PANEL
BUN: 7 mg/dL (ref 6–23)
Calcium: 9.3 mg/dL (ref 8.4–10.5)
Creatinine, Ser: 0.91 mg/dL (ref 0.4–1.2)
GFR calc non Af Amer: >60 mL/min (ref >60)
Glucose, Bld: 89 mg/dL (ref 70–99)

## 2011-03-22 LAB — GLUCOSE, CAPILLARY

## 2011-03-22 LAB — FACTOR 5 LEIDEN

## 2011-03-22 NOTE — Assessment & Plan Note (Signed)
[facility]                         GASTROENTEROLOGY OFFICE NOTE   NAME:Eckford, Vernell                             MRN:          [id number]  DATE:11/25/2006                            DOB:          April 20, 1935    PROBLEM:  Esophageal stricture.   REASON:  Mrs. Koskela has returned following upper and lower endoscopy.  The  former was normal.  She was dilated with a 17-mm dilator, however,  because of complaints of dysphagia.  Mrs. Curb reports significant  improvement in her swallowing.  She no longer has dysphagia.  Several  small colon polyps were removed.  Adenomatous changes were seen at  pathology.   EXAMINATION:  Pulse 78, blood pressure 130/70, weight 149.   IMPRESSION:  1. Esophageal stricture - asymptomatic following dilatation therapy.  2. Colonic polyposis.   RECOMMENDATIONS:  1. Repeat dilatation as needed.  2. Followup colonoscopy in approximately 5 years.     Barbette Hair. Arlyce Dice, MD,FACG  Electronically Signed    RDK/MedQ  DD: 11/25/2006  DT: 11/25/2006  Job #: [id number]   cc:   Loreen Freud, M.D.

## 2011-03-22 NOTE — Op Note (Signed)
[name], [name]                    ACCOUNT NO.:  [id number]   MEDICAL RECORD NO.:  [id number]          PATIENT TYPE:  INP   LOCATION:  NA                           FACILITY:  [facility]   PHYSICIAN:  Petra Kuba, M.D.    DATE OF BIRTH:  April 09, 1935   DATE OF PROCEDURE:  11/28/2004  DATE OF DISCHARGE:                                 OPERATIVE REPORT   PROCEDURE PERFORMED:  Esophagogastroduodenoscopy with biopsy.   ENDOSCOPIST:  Petra Kuba, M.D.   INDICATIONS:  GI bleed, presumed upper.   CONSENT:  Consent was signed after risks, benefits, methods and options were  thoroughly discussed prior to any premeds given.   MEDICATIONS:  Medicines used Demerol 40 and Versed 5.   DESCRIPTION OF PROCEDURE:  The video endoscope was inserted via direct  vision.  The esophagus was normal.  In the distal esophagus there was a tiny  small hiatal hernia.  The scope was passed into the stomach and advanced  into the antrum, which was pertinent for being very deformed with multiple  erosions, but no frank ulcerations or signs of bleeding.  The scope was  passed through the pylorus into the duodenal bulb where some shallow small  duodenal ulcers were seen; none were deemed significant.  No visible vessels  or signs of bleeding were seen.  The scope was advanced around the cecum to  a normal second portion of the duodenum.  No blood was seen distally.  The  scope was withdrawn back into the bulb and a good look there confirmed the  above findings.   The scope was withdrawn back into the stomach and retroflexed.  Angularis,  cardia, fundus, lesser and greater curve were all normal, except for the  hiatal hernia being confirmed in the cardia.  Straight visualization of the  stomach confirmed the highly abnormal antrum, but ruled out any other  abnormalities on the lesser or greater curve.  The antrum was washed and  watched; could not be made to bleed.   The scope was reinserted into the duodenal bulb  again.  This area was  washed, but none of the shallow superficial ulcers could be made to bleed.  We withdrew back to the antrum.  One biopsy for the CLOtest was obtained to  rule out Helicobacter.   Air and water were suctioned from the stomach.  The scope was slowly  withdrawn again confirming a normal esophagus.  The scope was removed.   The patient tolerated the procedure well.   COMPLICATIONS:  There were no obvious immediate complications.   ENDOSCOPIC DIAGNOSES:  1.  Tiny small hiatal hernia.  2.  Antral erosions status post CLO biopsy to rule out Helicobacter pylori.  3.  Small multiple, shallow, superficial duodenal ulcers.  4.  No active bleeding seen or any _________ lesions.   PLAN:  1.  No aspirin or nonsteroidals, long term.  2.  Pump inhibitors, long term.  3.  Consider repeat endoscopy to document healing in two to three months;      however, if she  needs to get back on aspirin or nonsteroidals at that      juncture we will need to make sure she stays on pump inhibitors.  4.  Consider a colonoscopy as an outpatient when endoscopy is done since she      has not had any previous colonic screening; however, if signs of      bleeding continue that seems lower would proceed with that or consider      nuclear bleeding scan if there is a question as to where the blood is      coming from.  5.  In the meantime we will put her on clear liquids.  6.  Follow H&H and BUN as you are doing.  7.  We can change her to p.o. b.i.d. pump inhibitors.      MEM/MEDQ  D:  11/28/2004  T:  11/29/2004  Job:  [id number]   cc:   Lonia Blood, M.D.

## 2011-03-22 NOTE — Letter (Signed)
September 17, 2006    [name], M.D.  [address] [place], [place] [address]   RE:  [name], [name]  MRN:  [id number]  /  DOB:  1935/08/07   Dear Dr. Laury Axon:   Upon your kind referral, I had the pleasure of evaluating your patient and I  am pleased to offer my findings.  I saw [name] in the office today.  Enclosed is a copy of my progress note that details my findings and  recommendations.   Thank you for the opportunity to participate in your patient's care.    Sincerely,      Lori Hair. Arlyce Dice, MD,FACG  Electronically Signed    RDK/MedQ  DD: 09/17/2006  DT: 09/17/2006  Job #: [id number]

## 2011-03-22 NOTE — Consult Note (Signed)
NAMEANASTASSIA, [name]                    ACCOUNT NO.:  [id number]   MEDICAL RECORD NO.:  [id number]          PATIENT TYPE:  INP   LOCATION:  NA                           FACILITY:  [facility]   PHYSICIAN:  Petra Kuba, M.D.    DATE OF BIRTH:  22-Jun-1935   DATE OF CONSULTATION:  11/28/2004  DATE OF DISCHARGE:                                   CONSULTATION   HISTORY:  The patient is seen at the request of the [facility] for presumed upper GI bleeding.  She has had black stool for three  to four days.  She did vomit about three or four days ago without any blood.  She has had some vague abdominal pain, and a history of IBS, but no previous  GI workup.  She has been on lots of aspirin and nonsteroidals.  She has had  no previous GI bleeding.   PAST MEDICAL HISTORY:  Pertinent for some diet-controlled diabetes, high-  blood pressure, asthmatic bronchitis, arthritis as well as high blood  pressure.   She has had back surgery as well as peritonitis and C-section complicated by  peritonitis and surgery after that.   ALLERGIES:  PENICILLIN AND CODEINE.   SOCIAL HISTORY:  She does use some over the counter aspirin and  nonsteroidals, but does not smoke or drink.   CURRENT MEDICATIONS:  1.  Advair.  2.  Relafen.  3.  Benicar.  4.  Atenolol and another blood pressure pills.  5.  Zocor.  6.  Albuterol.   REVIEW OF SYSTEMS:  Pertinent for muscles aches and pains, weakness and  shortness of breath for the last three days, but no weight loss or other  complaints.   PHYSICAL EXAMINATION:  GENERAL:  In no acute distress, lying comfortably in  the bed.  VITAL SIGNS:  Stable.  Afebrile.  LUNGS:  Clear.  Regular rate and rhythm.  ABDOMEN:  Soft, nontender, good bowel sounds.   LABORATORY DATA:  Pertinent for a low hemoglobin with an increased BUN,  guaiac positive per the medical team.   ASSESSMENT:  1.  Presumed upper gastrointestinal bleeding.  2.  Arthritis on aspirin and  nonsteroidals.  3.  Diabetes.  4.  Hypertension.   PLAN:  Agree with transfusions, IV Protonix for now. The risks, benefits,  methods of endoscopy were thoroughly discussed with the patient, __________  given.  Talked about ways that we stopped bleeding, including cautery,  __________ therapy, etc.  We will go ahead and proceed  now that she has  already got one unit of blood, and  seems stable.  Further workup and plan  pending those findings.      MEM/MEDQ  D:  11/28/2004  T:  11/28/2004  Job:  [id number]   cc:   [facility]

## 2011-03-22 NOTE — Discharge Summary (Signed)
[name], [name]                                ACCOUNT NO.:  [id number]   MEDICAL RECORD NO.:  [id number]                   PATIENT TYPE:  IPS   LOCATION:  4005                                 FACILITY:  [facility]   PHYSICIAN:  Ellwood Dense, M.D.                DATE OF BIRTH:  November 14, 1934   DATE OF ADMISSION:  12/14/2002  DATE OF DISCHARGE:  12/24/2002                                 DISCHARGE SUMMARY   DISCHARGE DIAGNOSES:  1. Lumbar spinal stenosis.  2. Pain management.  3. Hypertension.  4. Constipation, resolved.  5. Hyperlipidemia.  6. Asthma.   PROCEDURE:  Status post lumbar 4-5 decompressive laminectomy with fusion  December 08, 2002.   HISTORY OF PRESENT ILLNESS:  A 76 year old female admitted January 21 with  chronic back pain and surgery in 1967 who presented with severe bilateral  lower extremity pain and no change with conservative care.  Upon  examination, x-ray and imaging showed L4-5 degenerative grade 1  spondylolisthesis with severe stenosis.  Underwent L4-5 decompressive  laminectomy with foraminotomies and fusion December 08, 2002 per Dr. Jordan Likes.  A  back corset was applied.  Foley catheter tube removed.  Bouts of  constipation with laxative assistance provided.  Required supervision for  bed mobility, ambulating short distances with a standard walker.  Latest  chemistries unremarkable.  Sedimentation rate of 130 and followup of 100.  Admitted for a comprehensive rehab program.   PAST MEDICAL HISTORY:  See discharge diagnoses.   ALLERGIES:  1. PENICILLIN.  2. CODEINE.   PRIMARY CARE PHYSICIAN:  She does not have a primary M.D.   MEDICATIONS PRIOR TO ADMISSION:  1. Flexeril 10 mg at bedtime.  2. Vicodin as needed.  3. Flovent inhaler twice daily.  4. Serevent 2 puffs twice daily.  5. Aspirin daily.  6. Maxzide daily.  7. Atenolol 25 mg daily.  8. She had recently completed a prednisone taper for back pain.   SOCIAL HISTORY:  Recently moved from  [place] in October 2002 to  [place].  She was independent with a cane prior to admission.  One-level  home, one step to entry.  Plans to stay with her daughter in [place]  until mobility improves.  Her daughter works day shift.   HOSPITAL COURSE:  The patient did well while on rehabilitation services with  therapies initiated on a b.i.d. basis.  The following issues were followed  during the patient's rehab course.  Pertaining to the patient's lumbar  stenosis with lumbar L4-5 decompressive laminectomy December 08, 2002,  surgical site healing nicely.  Sutures have been removed.  No signs of  infection.  A back corset was on when out of bed as per neurosurgery, Dr.  Jordan Likes.  Pain management ongoing.  She was weaned from her OxyContin due to  some mild altered mental status, which resolved after discontinuation of  narcotics.  She was placed on Neurontin 100 mg at bedtime.  She had no bowel  or bladder disturbances.  Blood pressures remained controlled with atenolol  and Maxzide, which were her home doses.  Bout of constipation resolved with  laxative assistance.  It was noted that the patient had been a long-term  user of psyllium husk that she used three times a day prior to hospital  admission.  She had a very mild history of asthma.  She was using her  inhalers as prior to hospital admission.  Her oxygen saturations remained  greater than 90% on room air.  During her rehab course, she was treated for  an E. coli urinary tract infection with Bactrim x7 days.  A followup  sedimentation rate was 98.  She received followup per neurology services,  Dr. Anne Hahn.  Noted anemia of 7.9.  She was transfused on December 15, 2002  with 2 units of packed red blood cells.  She remained asymptomatic.  Overall, for her functional mobility, she was minimal assist to supervision  for ambulation 30 feet x2, modified independence for activities of daily  living needing some assistance for lower body  dressing.  Home health  therapies have been arranged.   LABORATORY DATA:  Latest labs showed a hemoglobin 11.1, hematocrit 32, WBC  of 13, platelet 353.  Sodium 135, potassium 4.1, BUN 18, creatinine 1.9.   DISCHARGE MEDICATIONS:  1. Maxzide daily.  2. Atenolol 25 mg daily.  3. Serevent inhaler 2 puffs twice daily.  4. Aspirin 81 mg daily.  5. Flovent 2 puffs every 12 hours.  6. Neurontin 100 mg at bedtime.  7. Bactrim DS 1 tablet twice daily x4 days.  8. Tylox as needed.   ACTIVITY:  Back corset when out of bed.   DIET:  Her diet was regular.    SPECIAL INSTRUCTIONS:  Home health physical and occupational therapy.  She  should follow up with Dr. Jordan Likes of neurosurgery, call for appointment; Dr.  Anne Hahn [phone].     [name], P.A.                     Ellwood Dense, M.D.    DA/MEDQ  D:  12/23/2002  T:  12/23/2002  Job:  [id number]   cc:   Henry A. Pool, M.D.  [address]  [place]  [place] [address]  Fax: [phone]   C. Lesia Sago, M.D.  [address]  [address]  [place]  [place] [address]  Fax: [phone]

## 2011-03-22 NOTE — Discharge Summary (Signed)
NAMEALICHIA, [name]                                ACCOUNT NO.:  [id number]   MEDICAL RECORD NO.:  [id number]                   PATIENT TYPE:  INP   LOCATION:  3004                                 FACILITY:  [facility]   PHYSICIAN:  Marlan Palau, M.D.               DATE OF BIRTH:  30-Jun-1935   DATE OF ADMISSION:  11/24/2002  DATE OF DISCHARGE:  12/14/2002                                 DISCHARGE SUMMARY   ADMISSION DIAGNOSES:  1. Severe pain in the low back, legs, left shoulder, rule out rheumatologic     etiology.  2. Obesity.  3. Hypertension.  4. History of asthma.   DISCHARGE DIAGNOSES:  1. Severe lumbosacral spinal stenosis.  2. Obesity.  3. Elevated sedimentation rate, possible underlying rheumatologic process.  4. Hypertension.  5. Asthma.   PROCEDURES DURING THIS ADMISSION:  1. MRI scan of the lumbosacral spine.  2. Decompressive laminectomy, L4-5, with foraminotomies and L4-5 posterior     lumbar interbody fusion.  3. MRI scan of the abdomen.  4. Bone scan.  5. CT of the chest, abdomen, and pelvis.  6. X-ray of the right foot and left shoulder.  7. MRI scan of the cervical spine.   COMPLICATION OF ABOVE PROCEDURES:  None.   HISTORY OF PRESENT ILLNESS:  The patient is a 76 year old [place] female  born [birth year] with a history of severe pain in the lower back, legs, both  sides over the last six months.  The pain has progressed to the point where  the patient is unable to ambulate effectively.  The patient was seen by Dr.  Orlin Hilding as an outpatient.  She showed up for an MRI scan of the low back,  but the patient was unable to undergo the MRI scan due to severe pain.  The  patient came to the emergency room for an evaluation and was also noted  within a week prior to admission to have severe shoulder pain on the left  and right foot pain.  The patient was admitted to the hospital for further  evaluation.  The patient claims that she has lost about 18 pounds over  the  last several months and is not able to function independently at home.   PAST MEDICAL HISTORY:  1. History of chronic pain, rule out rheumatologic etiology.  2. Obesity.  3. Hypertension.  4. Hypercholesterolemia.  5. Asthma.  6. Thyroid disease.  7. Lumbosacral laminectomy in 1965 with a repeat decompressive surgery of L4-     5 this admission.  8. C-section.  9. History of pneumonia.  10.      History of suicide attempt in early 74s.   MEDICATIONS:  1. Maxzide 50/75 mg tablet one a day.  2. Atenolol 25 mg a day.  3. Serevent inhaler b.i.d.  4. Flovent inhaler b.i.d.  5. Albuterol inhaler as needed.  6. Aspirin  81 mg a day.  7. Relafen 500 mg b.i.d.  8. Tylenol as needed.   ALLERGIES:  PENICILLIN, CODEINE, AND SOME ANTIBIOTICS.   LABORATORY DATA:  During this hospitalization, white count 23.4 on  admission, hemoglobin 11.8, hematocrit 34.6, platelets 330.  The patient had  only 1 eosinophil.  Sed rate was 128, repeat 130, repeat again 100 on  prednisone.  INR 1.0, sodium 134, potassium 4.5, chloride 95, CO2 22,  glucose 163, BUN 29, creatinine 1.8, calcium 10.0, total protein 8.0.  Albumin 3.3, AST 49, ALT 62, ALP 163, total bilirubin 2.0. Repeat liver  enzymes were unremarkable.  BUN and creatinine have come down to 38 and 1.3,  respectively.  Serum protein electrophoresis shows nonspecific pattern  consistent with an acute inflammatory response.  TSH 1.27.  Hepatitis B core  antibody positive.  Hepatitis C  antibody negative.  Hepatitis B surface  antibody positive.  HIV nonreactive.  Hepatitis B surface antigen negative.  CEA level 1.8.  C-reactive protein extremely elevated at 29.5.  Complement  C3 level high at 266.  C4 is high at 80.  Delta aminolevulinic acid in the  urine is unremarkable.  Porphobilinogen level normal.  Urinalysis reveals  specific gravity of 1.016, pH 6.5, 0-2 red cells, 0-2 white cells.  Rheumatoid factor negative.  RPR nonreactive.  ANA  negative.  SSA antibodies  unremarkable.  SSB antibodies unremarkable.  Cryoglobulin levels negative.  Glutamic acid decarboxylase antibodies less than 1.  ANCA level still  pending.   MRI scan of the abdomen showed small cavernous hemangioma of left hepatic  lobe, otherwise unremarkable.  Bone scan, total body, showed some  degenerative changes in the lower lumbar spine, otherwise unremarkable.  CT  of the chest, abdomen, and pelvis showed diffuse fatty liver infiltrates in  the liver.  CT of the pelvis was unremarkable.  CT of the chest showed  changes of COPD, and advanced right lower lobe scarring changes were seen.  Foot x-ray was normal.  Left shoulder x-ray showed a lucency in the superior  aspect of the acromion near the [facility] joint.  Chest x-ray showed left basilar  atelectasis, small left pleural effusion, cardiomegaly.  MRI scan of the  cervical spine showed shallow central disk protrusion of C2-3, C4-5, C5-6.  No significant neural compression seen.  Lumbar spine MRI scan showed  spondylolisthesis at the L4-5 level, herniated disk, severe facet disease,  short pedestals, significant spinal stenosis, lateral recess and foraminal  stenosis.  At the L5-S1 level, there was a small annular rent and shallow  disk protrusion, and no significant neural compression was seen.  EKG  revealed normal sinus rhythm, anterior infarct, age undetermined, and  possible inferior infarct, age undetermined.   HOSPITAL COURSE:  The patient was admitted to [facility] for  evaluation of her severe pain.  This patient was noted to have very high  sedimentation rates and elevated C-reactive protein.  A rheumatology consult  was obtained via Dr. Estill Bakes.  Workup was undertaken.  The patient has not  yet had a determined cause of her elevated sedimentation rate.  The  possibility of polymyalgia rheumatica or giant cell aortitis is entertained. The patient was given a short course of prednisone 60  mg a day with some  minimal benefit seen.  The patient's left shoulder pain and right foot pain  actually starting improving before prednisone was started.  The patient was  found to have positive core antibody for hepatitis B.  Infectious disease  was consulted, but it was felt that the hepatitis B was inactive and was not  the cause of the current complaints.  The patient was seen by neurosurgery  via Dr. Jordan Likes and a second opinion via Dr. Venetia Maxon at the patient's request.  The patient has decided to have lumbosacral spine surgery due to severe  disabling pain.  This procedure was done on 12/08/2002.  This was performed by  Dr. Jordan Likes.  The patient has had reduction of low back pain and leg pain.  She  has now had some worsening of left shoulder pain.  The patient was off of  prednisone prior to surgery.  The patient has been seen by the  rehabilitation consult team, and it was felt that she may be an adequate  candidate for inpatient rehabilitation.  The patient will be transferred to  rehabilitation at some point for this.  The cause of the underlying elevated  sedimentation rate again is unclear.   TRANSFER/DISCHARGE MEDICATIONS:  1. Maxzide 75/50 mg tablet one daily.  2. Tenormin 25 mg daily.  3. Serevent inhaler, two puffs q.12h.  4. Aspirin 81 mg a day.  5. Flovent inhaler, two puffs q.12h.  6. Pepcid 20 mg b.i.d.  7. OxyContin 20 mg tablet twice a day for pain.  8. Senokot one tablet twice a day.  9. Zofran if needed.  10.      Ensure, one can twice a day.  11.      Albuterol inhaler, two puffs every six hours as needed.  12.      Laxative of choice.  13.      Restoril 30 mg at night if needed.  14.      Flexeril 5 mg three times a day if needed.  15.      Demerol and Phenergan if needed for pain.  16.      The patient may have diazepam 5-10 mg if needed every six hours.   DISCHARGE INSTRUCTIONS:  The patient will be followed by neurology and  possibly by rheumatology upon  transfer.  The patient may require treatment  with prednisone once the healing process has occurred.  It is not clear as  to whether or not the patient may require a muscle or nerve biopsy in the  future or possible temporal artery biopsy.  Muscle enzyme levels were  initially elevated at 688, but repeat study was 63.  Thyroid profile was  unremarkable.                                               Marlan Palau, M.D.    CKW/MEDQ  D:  12/14/2002  T:  12/14/2002  Job:  [id number]   cc:   Areatha Keas, M.D.  [address]  [address]  [place]  [place] [address]  Fax: [phone]   Kathaleen Maser. Pool, M.D.  [address]  [place]  [place] [address]  Fax: [phone]   Bryan Lemma. Manus Gunning, M.D.  [address] [place]  [place] [address]  Fax: [phone]   [facility]

## 2011-03-22 NOTE — Op Note (Signed)
NAMEJAYLI, [name]                                ACCOUNT NO.:  [id number]   MEDICAL RECORD NO.:  [id number]                   PATIENT TYPE:  INP   LOCATION:  3004                                 FACILITY:  [facility]   PHYSICIAN:  Kathaleen Maser. Pool, M.D.                 DATE OF BIRTH:  1935-10-26   DATE OF PROCEDURE:  12/08/2002  DATE OF DISCHARGE:                                 OPERATIVE REPORT   PREOPERATIVE DIAGNOSES:  L4-5 degenerative grade 1 spondylolisthesis with  severe stenosis.   POSTOPERATIVE DIAGNOSES:  L4-5 degenerative grade 1 spondylolisthesis with  severe stenosis.   OPERATION PERFORMED:  L4-5 decompressive laminectomy with foraminotomies.  L4-5 posterior lumbar interbody fusion utilizing tangent wedges and local  autograft.  L4-5 posterolateral fusion utilizing pedicle screw  instrumentation and local autograft.   SURGEON:  Kathaleen Maser. Pool, M.D.   ASSISTANT:  Donalee Citrin, M.D.   ANESTHESIA:  General endotracheal.   INDICATIONS FOR PROCEDURE:  The patient is a 76 year old female with a  history of severe back and bilateral lower extremity pain.  The patient has  failed conservative management.  She has had to be admitted for pain control  and has been in the hospital for the past two weeks with pain control  issues.  Coincidentally, the patient was noted to have an elevated  erythrocyte sedimentation rate.  This has been worked up with a full  rheumatological work-up.  Only abnormality found is the patient does have  evidence of hepatitis B.  There is some thought that she may have some  degree of autoimmune myositis or arteritis.  That work-up is ongoing.  She  has been cleared by the rheumatology service and neurology services for  surgery for treatment of her severe lumbar stenosis.  The patient is aware  of the risks and benefits including but not limited to the risks of  anesthesia, bleeding, infection, CSF leak, nerve root injury, fusion  failure, instrumentation  failure, continued pain and nonbenefit.  The  patient has been given the opportunity to ask questions and appears to  understand.  She wishes to proceed with surgery.   DESCRIPTION OF PROCEDURE:  The patient was taken to the operating room and  placed on the table in the supine position.  After adequate level of  anesthesia was achieved, the patient was positioned prone onto a Wilson  frame and appropriately padded.  The patient's lumbar region was prepped and  draped sterilely.  A 10 blade was used to make a linear skin incision  overlying the L3, 4 and 5 levels.  This was carried down sharply in the  midline.  A subperiosteal dissection was then performed exposing the lamina  and facet joints of L3, L4 and L5 as well as the transverse processes of L4  and L5.  Deep self-retaining retractor was placed.  Intraoperative  fluoroscopy  was used, the level was confirmed.  Decompressive laminectomy  was then performed using Leksell rongeurs, Kerrison rongeurs and a high  speed drill to remove the entire lamina of L4, the superior one third of the  lamina of L5, the entire inferior facets of L4 bilaterally and the majority  of the superior facets of L5 bilaterally.  All bone was cleaned and used in  later autografting.  The ligamentum flavum was then elevated and resected in  piecemeal fashion using Kerrison rongeurs.  Wide foraminotomies were then  performed along the course of the exiting L4 and L5 nerve roots.  The wound  was then irrigated.  The disk space was inspected.  The epidural venous  plexus was coagulated and cut.  Starting first on the patient's left side,  the thecal sac and nerve roots were mobilized and retracted toward the  midline.  The disk space was then entered with a 15 blade and incised in a  rectangular fashion.  A wide disk space cleanout was then achieved pituitary  rongeurs and upward angled pituitary rongeurs and Epstein curets.  A 9 mm  distractor was placed and  attention was placed to the patient's right side.  Once again the nerve roots were protected, the disk space was incised and an  aggressive diskectomy was performed.  The disk space was then distracted up  to 10 mm and a 10 mm distractor was left in place on the right side and the  distractor was removed from the left.  The thecal sac and nerve roots were  protected once again.  The disk space was then reamed with a 10 mm box  cutter and then cut with a 10 mm tangent chisel.  Soft tissue was removed.  A 10 x 26 mm tangent wedge was impacted in place and recessed approximately  2 mm from the posterior cortical margin.  Distracters were removed from the  patient's right side.  Nerve roots were protected on the patient's right  side.  The disk space was once again reamed and then cut with a 10 mm  chisel.  The disk space was further curettaged.  Morselized autograft was  then packed into the interspace for later fusion.  A second 10 x 26 mm  tangent wedge was then impacted into place and recessed approximately 2 mm  from the posterior cortical margin.  Intraoperative fluoroscopy revealed  good position of bone grafts with good reduction of her deformity.  Pedicles  of L4 and L5 were then isolated using surface landmarks and intraoperative  fluoroscopy.  Superficial bone overlying the pedicle was removed using the  high speed drill.  Each pedicle was then probed using the pedicle awl.  Each  pedicle awl tract was then capped using a 5.25 mm screw tap.  Each screw tap  hole was probed and found to be solidly within bone.  6.75 x 40 mm spiral 90  screw was placed on the left side at L4 and 6.75 x 35 mm screws were placed  bilaterally at L5.  A 5.75 x 40 mm screw was placed on the right side  secondary to small pedicle anatomy.  All four screws were found to be well  positioned within the bone.  Transverse processes of L4 and L5 were then decorticated using a high speed drill.  Morselized allograft  was packed  posterolaterally.  Short segment titanium rods were then contoured and  placed through the screw heads at L4 and L5.  Locking  caps were then placed  over the screw heads.  The locking caps were then engaged in a sequential  fashion with the construct under compression.  Final images revealed good  position of bone grafts and hardware at the proper operative level and  normal alignment of the spine.  The wound was then irrigated one final time.  Gelfoam was placed topically for hemostasis which was found to be good.  A  medium Hemovac drain was left in the epidural space.  The wound was then  closed in layers with Vicryl sutures.  Steri-Strips and sterile dressing  were applied.  There were no apparent complications.  The patient tolerated  the procedure well and she returned to the recovery room postoperatively.                                                Henry A. Pool, M.D.    HAP/MEDQ  D:  12/08/2002  T:  12/08/2002  Job:  [id number]

## 2011-03-22 NOTE — H&P (Signed)
[name], [name] NO.:  [id number]   MEDICAL RECORD NO.:  [id number]                   PATIENT TYPE:  INP   LOCATION:  3014                                 FACILITY:  [facility]   PHYSICIAN:  Marlan Palau, M.D.               DATE OF BIRTH:  [birth year]   DATE OF ADMISSION:  11/24/2002  DATE OF DISCHARGE:                                HISTORY & PHYSICAL   HISTORY OF PRESENT ILLNESS:  The patient is a 76 year old right-handed,  [place] female born August 02, 2035 who comes to the [facility] Emergency Room for  evaluation of severe pain.  The patient has recently moved from the  [place] area and has had chronic pain for the last six months.  The  patient, however, apparently fell in the bathroom in mid December 2003 and  has had increased pain since that time.  The patient initially was seen by  Dr. Orlin Hilding approximately one week prior to this evaluation complaining of  primarily low back pain, pain down both legs, and some right foot pain.  Since that time, however, the patient has had alteration of the pain to  where it now has effected both shoulders, left much more than the right,  unable to lift the left arm up.  The patient denies any actual neck pain per  say.  Denies any problems controlling the bowels or bladder but is not able  to walk effectively due to the severe pain.  The patient was sent over for  an MRI scan of the lumbosacral spine but was unable to tolerate the  procedure due to severe pain.  The patient was sent from the MRI scanner to  the emergency room for further evaluation.   The patient believes that she has lost about 18 pounds over the last couple  months and is no longer able to function independently at home.  The patient  is now living with a daughter.   PAST MEDICAL HISTORY:  Significant for:  1. History of chronic pain, rule out rheumatologic etiology.  2. Obesity.  3. Hypertension.  4. Hypercholesterolemia.  5.  Asthma.  6. Thyroid diseases.  7. Lumbosacral laminectomy in 1965.  8. C section.  9. Pneumonia.  10.      History of suicide attempt in the past in her early 13s.   MEDICATIONS:  1. Maxzide 50/75 mg tablet q.d.  2. Atenolol 25 mg q.d.  3. Serevent inhaler b.i.d.  4. Flovent inhaler b.i.d.  5. Albuterol if needed.  6. Aspirin 81 mg q.d.  7. Relafen 500 mg 1 b.i.d.  8. Tylenol if needed.   ALLERGIES:  PENICILLIN, CODEINE, and SOME ANTIBIOTICS.   SOCIAL HISTORY:  The patient quit smoking in 1999.  She does not drink  alcohol.  The patient is divorced.  Currently living with her daughter.  She  has her own  place in [place].  She has two daughters and two sons.  One  son has a history of high cholesterol.  The patient is not employed.   FAMILY MEDICAL HISTORY:  Mother died of old age.  Father died with an MI.  The patient has one brother and five sisters.  One sister has renal disease  and diabetes.   REVIEW OF SYSTEMS:  Notable for fevers on this presentation today of 101.  The patient denied any fevers previously.  Denies significant headache or  neck pain.  Does note shortness of breath with asthma.  Notes it hurts to  cough.  Does note some occasional chest pains.  Denies nausea, vomiting or  abdominal pain.  Denies problems controlling bowels or bladder.  Denies any  true numbness with the exception of some slight tingling sensation of the  hands, right greater than left, and the right foot.  The patient mainly has  limited ability to do things with the arms and legs due to pain.   PHYSICAL EXAMINATION:  VITAL SIGNS:  Blood pressure 125/58, heart rate 95,  respiratory rate 20, temperature 101.0.  GENERAL:  This patient is a moderately obese, [place] female who is alert  and cooperative at the time of examination.  HEENT:  Head is atraumatic.  Eyes:  Pupils equal, round and reactive to  light.  Disks are flat bilaterally.  NECK:  Supple.  No carotid bruits.   RESPIRATORY:  Clear with the exception of some minimal wheezes posteriorly.  ABDOMEN:  Obese, soft, nontender.  EXTREMITIES:  Without significant edema.  No heat, warmth or redness is  seen.  NEUROLOGIC:  Cranial nerves as above.  Facial symmetry is present.  The  patient has good sensation of the face to pinprick bilaterally.  The patient  has good strength of the facial muscles and the muscles of head turning  bilaterally.  The patient will shrug the right shoulder, will not shrug the  left shoulder due to pain.  Good range of motion of the neck is noted.  The  patient has good strength on the right upper extremity, will not elevate or  abduct the left arm due to severe pain of the left shoulder, otherwise  distally has good strength in the left arm.  The patient has good strength  of both lower extremities.  The patient has some tenderness to palpation of  the right foot.  Deep tendon reflexes depressed in the lower extremities.  Toes neutral bilaterally.  The patient has normal strength and reflexes in  the upper extremities.  The patient is able to perform finger-nose-finger to  the right upper extremity, will not perform on the left due to shoulder  pain.  The patient is able to perform toe to finger bilaterally.  The  patient was not ambulated.  Pinprick, soft touch and vibratory sensation are  symmetric throughout.   LABORATORY VALUES:  Pending at this time.  Urinalysis has revealed specific  gravity of 1.016, pH 6.5, 15 mg/dcl of ketones, 0-2 white cells, 0-2 red  cells.  Chest x-ray and EKG are pending at this time.   IMPRESSION:  Severe pain bilateral shoulders, left greater than right, back  and legs, etiology unclear.  This patient really has a fairly unremarkable  examination except that she will not put forth full effort with the left arm  due to severe pain.  The patient will need an evaluation for rheumatologic  cause of her current complaints.  Will need  to rule out  other etiology such as radiculopathy or neoplastic involvement of spine or bones.   PLAN:  1. Admission to [facility].  2. MRI scan of the cervical spine.  3. MRI scan of the lumbosacral spine.  4. X-rays of the left shoulder and right foot, and chest x-ray.  5. Physical and occupational therapy evaluation.  6. Adequate pain management.  7.     Will check blood work to look for several rheumatologic factors.  8. Consultation with rheumatologist may be needed in the future.  9. May consider a bone scan at some point if all other studies are     unremarkable.                                               Marlan Palau, M.D.    CKW/MEDQ  D:  11/24/2002  T:  11/25/2002  Job:  [id number]   cc:   Bethann Berkshire, M.D.   [facility]  [address]  [address]

## 2011-03-22 NOTE — Assessment & Plan Note (Signed)
[facility]                           GASTROENTEROLOGY OFFICE NOTE   NAME:[name], [name]                             MRN:          [id number]  DATE:09/17/2006                            DOB:          1934/11/19    REASON FOR CONSULTATION:  Dysphagia.   Ms. [name] is a 76 year old Asian female referred through the courtesy of Dr.  Laury Axon for evaluation. She seems to be complaining of intermittent dysphagia  to solids consisting of difficulty swallowing meat. She is somewhat vague  with her symptoms but claims that at times she is unable to swallow meats  and has what sounds like dysphagia. She is able to swallow other solid  foods. She has no history of pyrosis. She has a history of black stools and  apparently was endoscoped 2-1/2 years ago. She has lost approximately 8  pounds, but she is unsure why. There is no history of hematochezia.   PAST MEDICAL HISTORY:  Pertinent for asthma, diabetes, hypertension, thyroid  disease and arthritis. She is status post appendectomy, cesarean sections x2  and laminectomy x2.   FAMILY HISTORY:  Noncontributory.   MEDICATIONS:  1. Vytorin.  2. Actos.  3. Benicar.  4. Atenolol.  5. Advair.  6. Tramadol.  7. Omeprazole.  8. Indapamide.   SHE IS ALLERGIC TO PENICILLIN AND CODEINE.   She does not smoke or drink. She is divorced and retired.   REVIEW OF SYSTEMS:  Is positive for joint pains, frequent cough, back pain.   PHYSICAL EXAMINATION:  On exam, pulse is 64, blood pressure 110/80, weight  143.  HEENT:  EOMI. PERRLA. Sclerae are anicteric.  Conjunctivae are pink.  NECK:  Supple without thyromegaly, adenopathy or carotid bruits.  CHEST:  Clear to auscultation and percussion without adventitious sounds.  CARDIAC:  Regular rhythm; normal S1 S2.  There are no murmurs, gallops or  rubs.  ABDOMEN:  Bowel sounds are normoactive.  Abdomen is soft, non-tender and non-  distended.  There are no abdominal masses,  tenderness, splenic enlargement  or hepatomegaly.  EXTREMITIES:  Full range of motion.  No cyanosis, clubbing or edema.  RECTAL:  Deferred.   IMPRESSION:  Questionable dysphagia. She could have an esophageal stricture  or perhaps a motility disorder.   RECOMMENDATION:  1. Upper endoscopy with possible dilatation.  2. Screening colonoscopy (to be done at the same time as upper endoscopy).     Barbette Hair. Arlyce Dice, MD,FACG  Electronically Signed    RDK/MedQ  DD: 09/17/2006  DT: 09/17/2006  Job #: [id number]   cc:   Loreen Freud, M.D.

## 2011-03-22 NOTE — Letter (Signed)
September 17, 2006    [address]oss[address] [place], [place] [address]   RE:  ALLISEN, PIDGEON  MRN:  [id number]  /  DOB:  05/03/1935   Dear Ms. Garofano:   It is my pleasure to have treated you recently as a new patient in my  office.  I appreciate your confidence and the opportunity to participate in  your care.   Since I do have a busy inpatient endoscopy schedule and office schedule, my  office hours vary weekly.  I am, however, available for emergency calls  every day through my office.  If I cannot promptly meet an urgent office  appointment, another one of our gastroenterologists will be able to assist  you.   My well-trained staff are prepared to help you at all times.  For  emergencies after office hours, a physician from our gastroenterology  section is always available through my 24-hour answering service.   While you are under my care, I encourage discussion of your questions and  concerns, and I will be happy to return your calls as soon as I am  available.   Once again, I welcome you as a new patient and I look forward to a happy and  healthy relationship.    Sincerely,      Barbette Hair. Arlyce Dice, MD,FACG  Electronically Signed    RDK/MedQ  DD: 09/17/2006  DT: 09/17/2006  Job #: [id number]

## 2011-03-22 NOTE — Discharge Summary (Signed)
[name], [name]                    ACCOUNT NO.:  [id number]   MEDICAL RECORD NO.:  [id number]          PATIENT TYPE:  INP   LOCATION:  0365                         FACILITY:  [facility]   PHYSICIAN:  Jonna L. Robb Matar, M.D.DATE OF BIRTH:  02-21-1935   DATE OF ADMISSION:  11/28/2004  DATE OF DISCHARGE:                                 DISCHARGE SUMMARY   PRIMARY CARE PHYSICIAN:  Unassigned.   GASTROENTEROLOGIST:  Dr. Madilyn Fireman.   FINAL DIAGNOSES:  1.  Nonsteroidal antiinflammatory drug induced gastric ulcers.  2.  Gastritis.  3.  Upper gastrointestinal bleed secondary to ulcers.  4.  Osteoarthritis.  5.  Type 2 diabetes.  6.  Escherichia coli urinary tract infection.  7.  Hypertension.  8.  Hypercholesterolemia.  9.  Epistaxis.  10. Hypokalemia.   PROCEDURE:  Esophagogastroduodenoscopy on November 28, 2004.   ALLERGIES:  PENICILLIN and CODEINE.   CODE STATUS:  Full.   HISTORY:  This hypertensive, diabetic, dyslipidemic 76 year old Asian  ancestry female has had chronic osteoarthritis, had been taking Relafen and  then switched to three to five ibuprofen for 6 weeks when she could not  afford the Relafen.  She developed weakness, melena, nausea, vomiting.   Physical exam was notable for mild orthostasis, tachycardia, and stool  positive for blood.  Initial white count was 17.0, hemoglobin 5.9, BUN 53,  creatinine 1.3, normal PTT and INR.   HOSPITAL COURSE:  The patient was admitted to the unit, given fluids, IV  Protonix, and ultimately 4 units of packed cells with immediate improvement,  and put on sliding scale for her diabetes.  The patient was seen in  consultation by Dr. Ewing Schlein on January 25.  Upper endoscopy showed multiple  small ulcers in the antrum of the duodenum.  The patient gradually improved  and her blood pressures normalized.  Her final hemoglobin was 11.5.   She also grew out E. coli from urine culture which was treated with 3 days  of ciprofloxacin to which it  was sensitive.  She also had a potassium that  went down to 3.2 that was supplemented.  White count at time of discharge  was down to 11.4.   DISPOSITION:  The patient will be discharged on:  1.  Omeprazole 20 mg q.a.m. and q.h.s.  2.  Nu-Iron 150 daily.  3.  Vitamin C 500 daily.  4.  She will go back on her Benicar 20 daily.  5.  Atenolol 50 daily.  6.  I have given her a prescription for Lovastatin 20 daily instead of      Zocor.  7.  She needs to have a substitute for analgesia because her arthritis is      quite significant.  However, she is very sensitive to pain medication so      I put her on Percocet syrup.  She is to take 1-5 mg up to q.i.d. as      needed for pain.  However, in practicality half of a Percocet 2.5 seems      to have done the trick for her  so she may actually need pretty low      doses.  8.  She should take Phenergan 12.5 mg one or two as needed for nausea or      sleep.  9.  She can try wintergreen or capsaicin salves on her hands and wrists.   She is to stay on low salt, low fat, no hot spices, and I have suggested for  the next month that she eat lean red meat every day to build her iron stores  back up.  I have given her the phone number for [facility] and she will  also call Dr. Madilyn Fireman to arrange for a colonoscopy in 2-4 weeks.   CONDITION ON DISCHARGE:  Satisfactory.   Time spent:  45 minutes.      JLB/MEDQ  D:  12/01/2004  T:  12/01/2004  Job:  [id number]   cc:   Everardo All. Madilyn Fireman, M.D.  [address]  [place]  [place] [address]  Fax: [phone]

## 2011-03-22 NOTE — Consult Note (Signed)
[name], [name]                                ACCOUNT NO.:  [id number]   MEDICAL RECORD NO.:  [id number]                   PATIENT TYPE:  INP   LOCATION:  3004                                 FACILITY:  [facility]   PHYSICIAN:  Danae Orleans. Venetia Maxon, M.D.               DATE OF BIRTH:  11/20/1934   DATE OF CONSULTATION:  12/05/2002  DATE OF DISCHARGE:                                   CONSULTATION   REFERRING PHYSICIAN:  Dr. Marlan Palau.   REASON FOR CONSULTATION:  L4-5 spondylolisthesis with prior lumbar  decompression, L4-5, with apparent lumbar fusion at the L4-5 level in 1965,  now with lower extremity pain and low back pain which is quite severe for  the patient.   HISTORY OF ILLNESS:  The patient is a 76 year old woman of Chinese origin,  who was admitted through the [facility] Emergency Room for evaluation of  severe pain.  She has had chronic pain for the last six months and has been  treated in [place].  She apparently fell in mid-December of 2003 and has  had increased pain since that time.  She had been admitted to the neurology  service on November 24, 2002.  Workup has involved rheumatologic evaluation  as she has a sed rate of 128 and also has elevated liver function tests with  positive hepatitis B markers.  She also has an elevated creatine  phosphokinase level of 688.   With regard to her lower back and lower extremity pain, the patient has  grade 1 spondylolisthesis of L4 and L5.  She has marked spinal stenosis at  L4-5 and has bilateral lower extremity pain which she says is very severe  when she stands and it prevents her from walking.  She denies any bowel or  bladder dysfunction.   PAST MEDICAL HISTORY:  Her past medical history is significant for chronic  pain, obesity, hypertension, hypercholesterolemia, asthma, thyroid disease,  lumbar laminectomy in 1965 with apparent fusion at that time, C-section,  pneumonia, history of suicide attempt in her early  77s.   MEDICATIONS:  Medications at the time of her admission included:  1. Maxzide 75/50 mg one tablet daily.  2. Atenolol 25 mg daily.  3. Serevent inhaler.  4. Flovent inhaler.  5. Albuterol as needed.  6. Aspirin 81 mg daily.  7. Relafen 500 mg twice daily.  8. Tylenol.   ALLERGIES:  She is allergic to PENICILLIN and CODEINE and SOME ANTIBIOTICS.   SOCIAL HISTORY:  She notes that she is divorced; her prior husband died.  She has two natural children and two adopted children.  She lives in  [place] with her daughter.   PHYSICAL EXAMINATION:  GENERAL:  On examination, she is a woman of quite  short stature; she says that she is 4-feet 8-inches tall, 130 pounds.  She  is a pleasant, cooperative, obese  [place] American woman.  VITAL SIGNS:  On physical examination, her temperature is 97.6, pulse is 74,  respiratory rate of 20, blood pressure of 123/67.  HEENT:  Her head is atraumatic.  NEUROLOGIC:  Pupils are equal, round and reactive to light.  Extraocular  movements are intact.  No diplopia.  Facial sensation and facial motor are  intact and symmetric.  Hearing is intact to finger rub.  Palate is upgoing.  Shoulder shrug is symmetric.  Upper extremity strength is full in all motor  groups bilaterally and symmetric.  Lower extremity strength is full in all  motor groups and bilaterally symmetric with the exception of 4/5 EHL  strength on the right and 4/5 EHL strength on the left.  Deep tendon  reflexes are depressed in the lower extremities.  Great toes are downgoing  to plantar stimulation.  Upper extremity reflexes are 2 and bilaterally  symmetric.  She has intact finger-to-nose-to-finger testing.   DIAGNOSTIC STUDIES:  I reviewed and the patient's MRI and plain x-rays with  her and her family.  They show grade 1 spondylolisthesis at L4 and L5 and  also marked spinal stenosis at the L4-5 level.  She has some mild  degenerative changes of the L5-S1 level and also at the  L3-4 level.   ASSESSMENT AND RECOMMENDATIONS:  I have recommended to the patient that she  would be a candidate for decompression and fusion at the L4-5 level.  She  has apparently been seen by Dr. Kathaleen Maser. Pool but wanted another opinion.  I  do think that her rheumatologic issues need to be resolved before surgery  can be considered.  I gave her my card and instructed her to call if I can  be of further assistance.                                               Danae Orleans. Venetia Maxon, M.D.    JDS/MEDQ  D:  12/05/2002  T:  12/06/2002  Job:  [id number]

## 2011-03-22 NOTE — H&P (Signed)
NAMECORBYN, STEEDMAN                    ACCOUNT NO.:  [id number]   MEDICAL RECORD NO.:  [id number]          PATIENT TYPE:  EMS   LOCATION:  ED                           FACILITY:  [facility]   PHYSICIAN:  Lonia Blood, M.D.      DATE OF BIRTH:  08-Dec-1934   DATE OF ADMISSION:  11/28/2004  DATE OF DISCHARGE:                                HISTORY & PHYSICAL   PRIMARY CARE PHYSICIAN:  The patient is currently unassigned.  Previously  followed with the [facility].   PRESENTING COMPLAINT:  Weakness and itching around the anal area.   HISTORY OF PRESENT ILLNESS:  This is a 76 year old Asian woman with a  history of diet controlled diabetes, hypertension, dyslipidemia, and chronic  arthritis, who has been pretty much taking three-five pills of 200 mg of  ibuprofen daily for the past six weeks.  She has previously been taking  Relafen for her arthritis.  She came in today secondary to generalized  weakness, as well as black stools.  The patient apparently started  experiencing perianal itching and black stools three-four days ago.  At the  same time she started experiencing some nausea and some elements of  vomiting, which have since stopped yesterday.  The weakness, however,  persisted and she came to the emergency room.  She denied any fever,  abdominal pain except for some mild discomfort in the lower abdomen.  She  also denied any hematemesis.  The patient did not take any alcohol,  especially recently.   PAST MEDICAL HISTORY:  1.  Type 2 diabetes, which is currently diet controlled.  2.  Hypertension.  3.  Arthritis.  4.  Dyslipidemia.   MEDICATIONS:  Include:  1.  Benicar once daily.  2.  Atenolol 50 daily.  3.  Ibuprofen.  4.  She also takes vitamin C, fish oil, and other multivitamins.  She has previously been on:  1.  Zocor.  2.  Relafen.  Of which she no longer can afford them.   ALLERGIES:  The patient has no known drug allergies.   SOCIAL HISTORY:  The patient lives in  [place].  She has two grown up  children all of them diabetic.  She denied any alcohol or tobacco use.  The  patient speaks excellent [place] and grew up here in [place].   FAMILY HISTORY:  Essential for diabetes and hypertension that runs all along  her family tree.   REVIEW OF SYSTEMS:  Essentially as in HPI.  The patient denied any recent  weight gain or weight loss.  She denied any shortness of breath, cough,  fever, nausea and vomiting.  She denied any PND, or orthopnea.  Abdomen is  as in HPI.  Extremities, she denied any swelling.  Positive for joint pain,  which is chronic due arthritis.   PHYSICAL EXAMINATION:  VITAL SIGNS:  Temperature is 97.2, blood pressure  initially 140/76 with a pulse of 136 while sitting.  Lying subsequently  orthostatics are as follows:  141/66 with a pulse of 122 lying and 128/78  with a  pulse of 132 sitting.  GENERAL:  The patient is a small framed woman in no acute distress.  She is  very conversant and very alert and oriented.  HEENT:  The patient is pale but not jaundiced.  The patient wears glasses.  Good dentition.  NECK:  Supple.  No JVD.  No lymphadenopathy.  CHEST:  Clear to auscultation bilaterally.  CARDIOVASCULAR:  The patient is tachycardiac.  ABDOMEN:  Soft, nontender.  Mild discomfort in the suprapubic area.  EXTREMITIES:  Showed no edema, cyanosis, or clubbing.  No real deformities  of the joints.   LABORATORIES:  Showed a white count of 17.0, hemoglobin 5.9, and platelet  count of 286 with ANC of 13.5.  Sodium is 130, potassium 3.7, chloride 104,  CO2 of 24, glucose 158, BUN 53, creatinine 1.3, calcium 9.0, total protein  6.1.  Her PTT is 12.8, INR 1.0.  A chest x-ray and EKG are currently  pending.   ASSESSMENT:  This is a 76 year old Asian woman presenting with an acute  gastrointestinal bleed.  The patient has an extensive history of NSAID  intake on a daily basis.  Per patient she always takes it with food.  However,  this does not prevent her from getting a gastrointestinal bleed  from that.  Other possibilities could be an inherent peptic ulcer, Vascular  dyscrasias, AVM etc. GI causes could still be something unrelated to the  NSAIDS, like gastric cancer.   PLAN:  1.  Admit her into the ICU or step-down at this point.  Start two wide bore      IVs.  IV Protonix.  Type and cross match four units of packed red blood      cells and transfuse two units right away.  Also give her IV fluids of      normal saline to keep her pressure stable.  We will go ahead and consult      GI for a possible EGD tonight or in the morning.  The patient has been      advised to avoid NSAIDS hence forth.  We will initiate serial CBC q.4h.      at this point.  2.  Diabetes.  The patient's diabetes is diet controlled.  We will continue      CBGs q.h.s. and just sliding scale insulin.  3.  Hypertension.  Her blood pressure seems to be okay at this point but in      the setting of a GI bleed we will hold off on aggressive therapy.  Once      she is stable with no further bleeding and if her blood pressure rises,      we will treat her accordingly.  4.  Dyslipidemia.  The patient is currently not on any treatment.  We will      go ahead and check a fasting lipid panel.  Subsequently when she is      stable if treatment is required we will go ahead and treat her for that.  5.  Leukocytosis.  This could be related to the GI bleed itself.  However,      the patient is complaining of suprapubic pain.  We will go ahead and      check a UA on her.  This will rule out a UTI, which may be unrelated to      what is going on right now.      LG/MEDQ  D:  11/28/2004  T:  11/28/2004  Job:  [id number]

## 2011-03-23 LAB — GLUCOSE, CAPILLARY
Glucose-Capillary: 142 mg/dL — ABNORMAL HIGH (ref 70–99)
Glucose-Capillary: 115 mg/dL — ABNORMAL HIGH (ref 70–99)
Glucose-Capillary: 118 mg/dL — ABNORMAL HIGH (ref 70–99)

## 2011-03-23 LAB — CBC
HCT: 26 % — ABNORMAL LOW (ref 36.0–46.0)
Platelets: 381 10*3/uL (ref 150–400)
RDW: 14.6 % (ref 11.5–15.5)
WBC: 16.5 10*3/uL — ABNORMAL HIGH (ref 4.0–10.5)

## 2011-03-23 LAB — HEPARIN LEVEL (UNFRACTIONATED)
Heparin Unfractionated: 0.87 IU/mL — ABNORMAL HIGH (ref 0.30–0.70)
Heparin Unfractionated: 0.81 IU/mL — ABNORMAL HIGH (ref 0.30–0.70)

## 2011-03-24 LAB — GLUCOSE, CAPILLARY
Glucose-Capillary: 129 mg/dL — ABNORMAL HIGH (ref 70–99)
Glucose-Capillary: 141 mg/dL — ABNORMAL HIGH (ref 70–99)
Glucose-Capillary: 150 mg/dL — ABNORMAL HIGH (ref 70–99)
Glucose-Capillary: 119 mg/dL — ABNORMAL HIGH (ref 70–99)

## 2011-03-24 LAB — CULTURE, BLOOD (ROUTINE X 2): Culture  Setup Time: 201205141017

## 2011-03-24 LAB — CBC
HCT: 27.2 % — ABNORMAL LOW (ref 36.0–46.0)
MCH: 30.1 pg (ref 26.0–34.0)
MCV: 87.2 fL (ref 78.0–100.0)
RBC: 3.12 MIL/uL — ABNORMAL LOW (ref 3.87–5.11)
WBC: 17.1 10*3/uL — ABNORMAL HIGH (ref 4.0–10.5)

## 2011-03-25 LAB — CBC
Hemoglobin: 9.3 g/dL — ABNORMAL LOW (ref 12.0–15.0)
MCH: 29.1 pg (ref 26.0–34.0)
RBC: 3.2 MIL/uL — ABNORMAL LOW (ref 3.87–5.11)

## 2011-03-25 LAB — GLUCOSE, CAPILLARY
Glucose-Capillary: 143 mg/dL — ABNORMAL HIGH (ref 70–99)
Glucose-Capillary: 125 mg/dL — ABNORMAL HIGH (ref 70–99)
Glucose-Capillary: 117 mg/dL — ABNORMAL HIGH (ref 70–99)

## 2011-03-26 LAB — CBC
HCT: 26.1 % — ABNORMAL LOW (ref 36.0–46.0)
MCH: 28.7 pg (ref 26.0–34.0)
MCHC: 33 g/dL (ref 30.0–36.0)
MCV: 87 fL (ref 78.0–100.0)
RDW: 14.8 % (ref 11.5–15.5)

## 2011-03-26 LAB — DIFFERENTIAL
Basophils Relative: 1 % (ref 0–1)
Eosinophils Relative: 1 % (ref 0–5)
Lymphs Abs: 2.2 10*3/uL (ref 0.7–4.0)
Monocytes Absolute: 1.5 10*3/uL — ABNORMAL HIGH (ref 0.1–1.0)

## 2011-03-26 LAB — GLUCOSE, CAPILLARY

## 2011-03-27 LAB — BASIC METABOLIC PANEL
Calcium: 9.4 mg/dL (ref 8.4–10.5)
Creatinine, Ser: 1.06 mg/dL (ref 0.4–1.2)
GFR calc Af Amer: >60 mL/min (ref >60)
GFR calc non Af Amer: 51 mL/min — ABNORMAL LOW (ref >60)
Glucose, Bld: 104 mg/dL — ABNORMAL HIGH (ref 70–99)
Sodium: 141 mEq/L (ref 135–145)

## 2011-03-27 LAB — DIFFERENTIAL
Basophils Absolute: 0.2 10*3/uL — ABNORMAL HIGH (ref 0.0–0.1)
Eosinophils Absolute: 0.4 10*3/uL (ref 0.0–0.7)
Lymphs Abs: 3.2 10*3/uL (ref 0.7–4.0)
Neutro Abs: 14.4 10*3/uL — ABNORMAL HIGH (ref 1.7–7.7)

## 2011-03-27 LAB — CBC
MCH: 28.4 pg (ref 26.0–34.0)
MCHC: 32.5 g/dL (ref 30.0–36.0)
RDW: 15.1 % (ref 11.5–15.5)

## 2011-03-27 LAB — GLUCOSE, CAPILLARY
Glucose-Capillary: 123 mg/dL — ABNORMAL HIGH (ref 70–99)
Glucose-Capillary: 97 mg/dL (ref 70–99)
Glucose-Capillary: 114 mg/dL — ABNORMAL HIGH (ref 70–99)

## 2011-03-27 LAB — HEPARIN LEVEL (UNFRACTIONATED): Heparin Unfractionated: 0.45 IU/mL (ref 0.30–0.70)

## 2011-03-28 ENCOUNTER — Inpatient Hospital Stay (HOSPITAL_COMMUNITY): Payer: Medicare HMO

## 2011-03-28 LAB — CBC
HCT: 29.1 % — ABNORMAL LOW (ref 36.0–46.0)
Hemoglobin: 9.7 g/dL — ABNORMAL LOW (ref 12.0–15.0)
MCHC: 33.3 g/dL (ref 30.0–36.0)
MCV: 87.4 fL (ref 78.0–100.0)
RDW: 15.3 % (ref 11.5–15.5)

## 2011-03-28 LAB — GLUCOSE, CAPILLARY: Glucose-Capillary: 193 mg/dL — ABNORMAL HIGH (ref 70–99)

## 2011-03-28 MED ORDER — IOHEXOL 300 MG/ML  SOLN
100.0000 mL | Freq: Once | INTRAMUSCULAR | Status: AC | PRN
  Administered 2011-03-28: 100 mL via INTRAVENOUS

## 2011-03-29 LAB — CBC
HCT: 28.3 % — ABNORMAL LOW (ref 36.0–46.0)
MCHC: 32.5 g/dL (ref 30.0–36.0)
MCV: 87.9 fL (ref 78.0–100.0)
Platelets: 260 10*3/uL (ref 150–400)
RDW: 16.1 % — ABNORMAL HIGH (ref 11.5–15.5)

## 2011-03-29 LAB — GLUCOSE, CAPILLARY: Glucose-Capillary: 110 mg/dL — ABNORMAL HIGH (ref 70–99)

## 2011-03-30 LAB — GLUCOSE, CAPILLARY
Glucose-Capillary: 143 mg/dL — ABNORMAL HIGH (ref 70–99)
Glucose-Capillary: 94 mg/dL (ref 70–99)

## 2011-03-30 LAB — CBC
HCT: 25.7 % — ABNORMAL LOW (ref 36.0–46.0)
Hemoglobin: 8.6 g/dL — ABNORMAL LOW (ref 12.0–15.0)
MCV: 88.9 fL (ref 78.0–100.0)
WBC: 14.5 10*3/uL — ABNORMAL HIGH (ref 4.0–10.5)

## 2011-03-30 LAB — BASIC METABOLIC PANEL
CO2: 29 mEq/L (ref 19–32)
Chloride: 104 mEq/L (ref 96–112)
Glucose, Bld: 139 mg/dL — ABNORMAL HIGH (ref 70–99)
Potassium: 3.2 mEq/L — ABNORMAL LOW (ref 3.5–5.1)
Sodium: 139 mEq/L (ref 135–145)

## 2011-03-31 LAB — CBC
Hemoglobin: 8.2 g/dL — ABNORMAL LOW (ref 12.0–15.0)
Hemoglobin: 8.8 g/dL — ABNORMAL LOW (ref 12.0–15.0)
MCH: 29.9 pg (ref 26.0–34.0)
MCH: 29.9 pg (ref 26.0–34.0)
MCHC: 32.9 g/dL (ref 30.0–36.0)
MCHC: 33.2 g/dL (ref 30.0–36.0)
Platelets: 263 10*3/uL (ref 150–400)
RDW: 17.1 % — ABNORMAL HIGH (ref 11.5–15.5)

## 2011-03-31 LAB — GLUCOSE, CAPILLARY
Glucose-Capillary: 99 mg/dL (ref 70–99)
Glucose-Capillary: 140 mg/dL — ABNORMAL HIGH (ref 70–99)

## 2011-04-01 LAB — GLUCOSE, CAPILLARY
Glucose-Capillary: 102 mg/dL — ABNORMAL HIGH (ref 70–99)
Glucose-Capillary: 122 mg/dL — ABNORMAL HIGH (ref 70–99)

## 2011-04-01 LAB — CBC
HCT: 25.2 % — ABNORMAL LOW (ref 36.0–46.0)
MCH: 29.1 pg (ref 26.0–34.0)
MCV: 90.6 fL (ref 78.0–100.0)
Platelets: 295 10*3/uL (ref 150–400)
RBC: 2.78 MIL/uL — ABNORMAL LOW (ref 3.87–5.11)

## 2011-04-01 LAB — BASIC METABOLIC PANEL
BUN: 13 mg/dL (ref 6–23)
CO2: 27 mEq/L (ref 19–32)
Chloride: 106 mEq/L (ref 96–112)
Glucose, Bld: 111 mg/dL — ABNORMAL HIGH (ref 70–99)
Potassium: 3.6 mEq/L (ref 3.5–5.1)
Sodium: 139 mEq/L (ref 135–145)

## 2011-04-02 LAB — GLUCOSE, CAPILLARY

## 2011-04-02 LAB — CBC
MCHC: 32.7 g/dL (ref 30.0–36.0)
Platelets: 298 10*3/uL (ref 150–400)
RDW: 17.7 % — ABNORMAL HIGH (ref 11.5–15.5)
WBC: 8.5 10*3/uL (ref 4.0–10.5)

## 2011-04-03 ENCOUNTER — Inpatient Hospital Stay (HOSPITAL_COMMUNITY): Payer: Medicare HMO

## 2011-04-03 LAB — GLUCOSE, CAPILLARY
Glucose-Capillary: 156 mg/dL — ABNORMAL HIGH (ref 70–99)
Glucose-Capillary: 118 mg/dL — ABNORMAL HIGH (ref 70–99)
Glucose-Capillary: 88 mg/dL (ref 70–99)

## 2011-04-04 LAB — GLUCOSE, CAPILLARY
Glucose-Capillary: 148 mg/dL — ABNORMAL HIGH (ref 70–99)
Glucose-Capillary: 102 mg/dL — ABNORMAL HIGH (ref 70–99)

## 2011-04-05 LAB — GLUCOSE, CAPILLARY
Glucose-Capillary: 157 mg/dL — ABNORMAL HIGH (ref 70–99)
Glucose-Capillary: 103 mg/dL — ABNORMAL HIGH (ref 70–99)
Glucose-Capillary: 152 mg/dL — ABNORMAL HIGH (ref 70–99)
Glucose-Capillary: 121 mg/dL — ABNORMAL HIGH (ref 70–99)

## 2011-04-06 LAB — GLUCOSE, CAPILLARY: Glucose-Capillary: 109 mg/dL — ABNORMAL HIGH (ref 70–99)

## 2011-04-07 LAB — GLUCOSE, CAPILLARY: Glucose-Capillary: 124 mg/dL — ABNORMAL HIGH (ref 70–99)

## 2011-04-12 NOTE — Discharge Summary (Signed)
  NAMEJENNETT, [name]                    ACCOUNT NO.:  [id number]  MEDICAL RECORD NO.:  [id number]  LOCATION:                                 FACILITY:  PHYSICIAN:  Sundra Aland, MD      DATE OF BIRTH:  Apr 04, 1935  DATE OF ADMISSION: DATE OF DISCHARGE:                              DISCHARGE SUMMARY   ADDENDUM: Discharge summary dictated on Apr 02, 2011.  However, the patient's pain had increased and it was decided the patient to discharge because the patient's pain has worsened.  Apparently, the patient complained that when she went for the last imaging studies, she was handled very roughly and because of that, her pain got worse and ambulation was affected, where she was able to ambulate with lot of difficulty, following that manipulation, the patient felt something had been broken.  We then got another MRI on Apr 03, 2011.  The impression said aside from slightly increased fluid signal intensity at L3-L4 intervertebral disk compatible with diskitis.  It appeared stable.  No significant change in the degree of impingement.  The MRI in comparison before this was done on Mar 19, 2011.  PHYSICAL EXAMINATION:  VITAL SIGNS:  Stable.  Blood pressure 123/74, heart rate 18, respiratory rate 18, and temperature 98.3, saturating 98% on room air.  She therefore was discharge in stable clinical condition.  DISCHARGE DISPOSITION:  Skilled nursing facility to assistance.  DISCHARGE ACTIVITIES:  The patient will continue rehab.     Sundra Aland, MD     LA/MEDQ  D:  04/05/2011  T:  04/05/2011  Job:  [id number]  Electronically Signed by Sundra Aland MD on 04/12/2011 02:55:57 PM

## 2011-04-15 ENCOUNTER — Other Ambulatory Visit: Payer: Medicare HMO

## 2011-04-16 NOTE — Group Therapy Note (Signed)
NAMEKEARSTIN, LEARN                    ACCOUNT NO.:  [id number]  MEDICAL RECORD NO.:  [id number]           PATIENT TYPE:  I  LOCATION:  3308                         FACILITY:  [facility]  PHYSICIAN:  Lonia Blood, M.D.DATE OF BIRTH:  1935-10-07                                PROGRESS NOTE   PRIMARY CARE PHYSICIAN: Lelon Perla, DO  ACTIVE DIAGNOSES AT THE PRESENT TIME: 1. L3-L4 diskitis. 2. Liver abscess.     a.     Left portal vein thrombosis.     b.     Transient microaerophilic strep bacteremia. 3. Possible transient ischemic attack on May 13 versus toxic metabolic     encephalopathy related to above. 4. Diabetes mellitus type 2. 5. Hypertension. 6. Normocytic anemia. 7. Dyslipidemia. 8. Reported history of asthma - not currently clinically active. 9. Degenerative disk disease/degenerative joint disease status post L4-     L5 laminectomy in 1965 with decompression in 2004. 10.Osteoporosis. 11.Gastroesophageal reflux disease with esophageal stricture dilated     in 2008. 12.Prior history of upper gastrointestinal bleeding.     a.     Episode in 2006 secondary to gastric ulcers.     b.     Episode in 2008 with source uncertain. 13.Cesarean section x2. 14.Prior history of tobacco abuse. 15.History of suicide attempt in her 77s. 16.PENICILLIN allergy. 17.Intolerance to CODEINE. 18.Acute renal failure - transient and resolved. 19.Grade 2 diastolic dysfunction - well compensated.  CONSULTATIONS TO DATE: 1. [facility]. 2. Pulmonary Critical Care Medicine. 3. Infectious Disease. 4. General Surgery. 5. Neurosurgery.  PROCEDURES TO DATE: 1. CT scan of the head without contrast on Mar 17, 2011 - no acute     intracranial findings. 2. Plain film x-rays of the lumbar spine on Mar 17, 2011 - findings     compatible with progressive diskitis and osteomyelitis of the L3-L4     level. 3. MRI and MRA of the head on Mar 19, 2011 - no acute infarct.     Cerebellar  small area of hemorrhagic breakdown products may be     related to prior hemorrhagic ischemia or possibly small cavernoma.     Mild small vessel disease type changes with mild atrophy.  Moderate     intracranial atherosclerotic type changes. 4. MRI of the lumbar spine on Mar 19, 2011 - progressive destruction     of the L3-L4 disk base and adjacent in place may represent     progressive diskitis/osteomyelitis.  L3-L4 multifactorial moderate     spinal stenosis and mild-to-moderate bilateral foraminal narrowing. 5. CT scan of the chest with contrast on Mar 19, 2011 - small pleural     effusions and mild bibasilar atelectasis.  Negative for pneumonia. 6. CT scan of the abdomen and pelvis on Mar 19, 2011 - thrombosis of     the left portal vein with a possible 2 cm abscess in the left lobe     of the liver.  This may be due to septic thrombophlebitis in the     portal vein. 7. Diskitis and osteomyelitis at L3-L4 with  extensive abnormality of     the pubic symphysis possibly related to chronic infection. 8. Transthoracic echocardiogram on Mar 18, 2011 - systolic function     normal.  Ejection fraction 60- 65%.  Evidence of grade 2 diastolic     dysfunction. 9. Transcranial Dopplers on Mar 18, 2011 - normal mean flow velocities     in anterior and posterior cerebellar circulations. 10.Carotid Dopplers bilaterally on Mar 18, 2011 - no significant     extracranial carotid artery stenosis demonstrated, it appeared that     left vertebral artery flow was patent with antegrade flow.  HOSPITAL COURSE: This summary is borrowed from an excellent note composed in the handwritten chart by Dr. Cliffton Asters from the Infectious Disease Service)  [name] is a pleasant 76 year old female who has chronic low back pain. This worsened in January leading to an admission at that time.  A lumbar aspirate at that time yielded negative gram-stains and cultures.  She received 1 week of vancomycin and Levaquin  and was discharged on a Medrol Dosepak.  Her back pain improved transiently but she began to worsen over the weeks prior to her admission and developed concomitant malaise and anorexia.  On May 13, the date of admission, the patient was found in her bathroom quite lethargic with appreciated left-sided weakness and slurred speech.  She was admitted to hospital.  The Stroke Service was consulted and a complete neurologic evaluation was carried out.  Ultimately there was no evidence of an acute CVA with the possibility of this did represent TIA cannot be ruled out.  The patient did, however, have a complete stroke evaluation, this includes swallowing study that cleared her for a diet.  The final recommendations from Neurology, for the patient to use aspirin 81 mg a day and to statin therapy when able.  Attention was then turned to the patient's chronic low back pain.  Her mental status had much improved.  Evaluations with the x-ray studies noted above suggested persistent significant diskitis. The patient also clinically began to display signs and symptoms of sepsis within 24 hours of her admission.  She was therefore transferred Intensive Care Unit and the Critical Care Team took over her care. Additionally, Neurosurgery was consulted.  No surgical intervention was felt to be appropriate.  The patient was treated with IV antibiotics. As there was concern that the patient's clinical scenario to include her septic picture were not consistent with typical picture of diskitis and a full evaluation to rule out other sources of infection was carried out.  This include a CT scan of the chest, abdomen and pelvis.  The studies revealed a liver abscess as discussed above with evidence of portal vein thrombosis.  General Surgery was consulted.  They did recommend anticoagulation for the patient's portal vein thrombosis. There was also felt, after discussion with General Surgery and Interventional  Radiology, the patient's liver abscess was not large enough to warrant drainage.  The patient has begun to stabilize significantly medically.  Antibiotic therapy has been directed by Infectious Disease who was consulted once the patient became more stable to help choose the appropriate treatment regimen.  General Surgery continues to follow.  The patient's other chronic medical problems appear to be relatively stable at the present time.  PHYSICAL EXAMINATION: GENERAL:  On the date of transfer - today, Ms. Stifter reports her chronic pain is stable.  It is not completely resolved but is somewhat ameliorated by her use of pain medications. VITAL SIGNS:  Today her temperature is 98.1 with a blood pressure of 166/68, heart rate of 69, respiratory rate of 15, and O2 sats 90-100% range.  Her CBGs ranging from 129-141. LUNGS:  Clear with exception of mild bibasilar crackles. HEART:  She displays regular rate and rhythm without gallop or rub and normal S1 and S2 on cardiac auscultation. ABDOMEN:  Benign with bowel sounds being positive.  No organomegaly.  No rebound or ascites. EXTREMITIES:  There is no significant lower extremity edema appreciated. NEUROLOGY:  The patient is alert, oriented with cranial nerves II-XII intact bilaterally.  She is interactive, pleasant lady, and often has many questions concerning her care.  DISPOSITION: At present time the patient is cleared for transfer from the Step-Down Unit to a medical bed.  It is anticipated that ongoing inpatient care will be required for a least 72 hours or more. ID continues to follow along with this as does General Surgery.     Lonia Blood, M.D.     JTM/MEDQ  D:  03/24/2011  T:  03/24/2011  Job:  [id number]  Electronically Signed by Jetty Duhamel M.D. on 04/15/2011 09:53:43 PM

## 2011-04-25 ENCOUNTER — Ambulatory Visit: Payer: Medicare HMO | Admitting: Family Medicine

## 2011-04-29 DIAGNOSIS — K750 Abscess of liver: Secondary | ICD-10-CM

## 2011-04-29 DIAGNOSIS — M4620 Osteomyelitis of vertebra, site unspecified: Secondary | ICD-10-CM

## 2011-04-29 DIAGNOSIS — M4640 Discitis, unspecified, site unspecified: Secondary | ICD-10-CM

## 2011-04-30 ENCOUNTER — Telehealth: Payer: Self-pay | Admitting: *Deleted

## 2011-04-30 ENCOUNTER — Ambulatory Visit: Payer: Medicare HMO | Admitting: Internal Medicine

## 2011-04-30 NOTE — Telephone Encounter (Signed)
Pt Evaluated on yesterday for physical therapy. Therapist would like to have verbal orders to have therapy twice a week for 4 week. Please advise

## 2011-04-30 NOTE — Telephone Encounter (Signed)
Verbal orders given awaiting fax.

## 2011-04-30 NOTE — Telephone Encounter (Signed)
Ok to give verbal order.

## 2011-05-01 ENCOUNTER — Encounter: Payer: Self-pay | Admitting: Internal Medicine

## 2011-05-01 ENCOUNTER — Ambulatory Visit (INDEPENDENT_AMBULATORY_CARE_PROVIDER_SITE_OTHER): Payer: Medicare HMO | Admitting: Internal Medicine

## 2011-05-01 VITALS — BP 147/78 | HR 68 | Temp 97.9°F | Ht <= 58 in | Wt 126.5 lb

## 2011-05-01 DIAGNOSIS — M4640 Discitis, unspecified, site unspecified: Secondary | ICD-10-CM

## 2011-05-01 DIAGNOSIS — M4620 Osteomyelitis of vertebra, site unspecified: Secondary | ICD-10-CM

## 2011-05-01 DIAGNOSIS — M519 Unspecified thoracic, thoracolumbar and lumbosacral intervertebral disc disorder: Secondary | ICD-10-CM

## 2011-05-01 DIAGNOSIS — M869 Osteomyelitis, unspecified: Secondary | ICD-10-CM

## 2011-05-01 MED ORDER — LEVOFLOXACIN 500 MG PO TABS: 500.0000 mg | ORAL_TABLET | Freq: Every day | ORAL | Status: AC

## 2011-05-01 MED ORDER — OXYCODONE-ACETAMINOPHEN 5-325 MG PO TABS: 1.0000 | ORAL_TABLET | Freq: Four times a day (QID) | ORAL | Status: DC | PRN

## 2011-05-01 MED ORDER — METRONIDAZOLE 500 MG PO TABS: 500.0000 mg | ORAL_TABLET | Freq: Three times a day (TID) | ORAL | Status: AC

## 2011-05-01 NOTE — Progress Notes (Signed)
  Subjective:    Patient ID: [name], female    DOB: April 14, 1935, 76 y.o.   MRN: [id number]  HPI [name] is in with her daughter today for her hospital followup visit. She is a 76 year old with chronic low back pain. In January of this year she noticed marked worsening of the pain and was admitted to the hospital. There was concern for lumbar infection and an aspirate was obtained but the Gram stain and cultures were negative. She received one week of vancomycin and Levaquin but these were stopped after the cultures returned negative. She was discharged on Medrol Dosepak and noted some transient improvement in her back pain. Her pain started to worsen again and she also developed malaise and anorexia. On May 13 she was found in her bathroom very lethargic with left-sided weakness and slurred speech and was readmitted. Her neurologic status improved quickly but imaging revealed a definite L3-4 discitis and a small abscess in the left lobe of her liver with associated portal vein thrombosis. Blood cultures grew microaerophilic streptococci.  She was treated with Levaquin and Flagyl. Her back pain started to improve slowly. A CT scan done just before discharge in late May showed that her small liver abscess had decreased in size from 20 mm to just over 9 mm in size. She was discharged and is now completing about 6 weeks of her antibiotic therapy. She has had some nausea that she believes is due to her antibiotics, but otherwise has tolerated them well. Her pain has continued to improve slowly and she now rates it about 5 on a scale of 1-10. She is hoping to start physical therapy at home. She is down to 3 Percocets daily but says that she feels like she needs more in order to be more active with tolerable pain.    Review of Systems     Objective:   Physical Exam  Constitutional: No distress.       She is in a wheelchair and wearing a fabric corset for lumbar support.  HENT:  Mouth/Throat: Oropharynx  is clear and moist. No oropharyngeal exudate.       Many missing teeth.  Cardiovascular: Normal rate, regular rhythm and normal heart sounds.   Pulmonary/Chest: Breath sounds normal.  Abdominal: Soft. Bowel sounds are normal. She exhibits no distension and no mass. There is no tenderness.  Psychiatric: She has a normal mood and affect.          Assessment & Plan:

## 2011-05-01 NOTE — Assessment & Plan Note (Signed)
Her liver abscess is improving with her antibiotic therapy and should be completely cured upon completion of 4 more weeks of therapy. I do not believe she needs a followup CT scan.

## 2011-05-01 NOTE — Discharge Summary (Signed)
  [name], LOWDERMILK                    ACCOUNT NO.:  [id number]  MEDICAL RECORD NO.:  [id number]  LOCATION:  3040                         FACILITY:  [facility]  PHYSICIAN:  Peggye Pitt, M.D. DATE OF BIRTH:  1935-10-28  DATE OF ADMISSION:  03/17/2011 DATE OF DISCHARGE:  04/08/2011                        DISCHARGE SUMMARY - REFERRING   ADDENDUM  Discharge diagnoses and medications remain the same.  The patient was kept in the hospital an extra 3 days because the patient's nursing facility refused her acceptance over the weekend because their pharmacy was closed.  No active issues have evolved over the weekend and she should be ready to discharge back to her facility today.     Peggye Pitt, M.D.     EH/MEDQ  D:  04/08/2011  T:  04/08/2011  Job:  [id number]  Electronically Signed by Peggye Pitt M.D. on 05/01/2011 07:39:56 PM

## 2011-05-01 NOTE — Assessment & Plan Note (Signed)
Her back pain is improving slowly suggesting that she is responding well to her antibiotic therapy. She is very concerned and for obvious reasons does not want the infection to flare up again. Given the severity of her infection and the chronicity of her infection I favor continuing antibiotics for another 4 weeks. Her sed rate was 135 and her C-reactive protein was nearly 28 when she was hospitalized. I will repeat those labs today and see her back in 4 weeks. I think it is important that she start more vigorous physical therapy. I have refilled her Percocet and allowed her some flexibility in dosing so that her pain will be well enough controlled for her to participate in that therapy.

## 2011-05-02 ENCOUNTER — Telehealth: Payer: Self-pay | Admitting: *Deleted

## 2011-05-02 NOTE — Telephone Encounter (Signed)
RN shared lab results with the pt.  She was very happy with these results.  She shared that after returning home yesterday her daughter compared the list of medications that was listed on the "after-visit summary" against that medications that the pt is currently taking.  The lists were the same.  Jennet Maduro, RN

## 2011-05-06 ENCOUNTER — Encounter: Payer: Self-pay | Admitting: Family Medicine

## 2011-05-06 ENCOUNTER — Ambulatory Visit (INDEPENDENT_AMBULATORY_CARE_PROVIDER_SITE_OTHER): Payer: Medicare HMO | Admitting: Family Medicine

## 2011-05-06 ENCOUNTER — Other Ambulatory Visit (INDEPENDENT_AMBULATORY_CARE_PROVIDER_SITE_OTHER): Payer: Medicare HMO

## 2011-05-06 VITALS — BP 126/72 | HR 69 | Temp 98.0°F | Wt 125.4 lb

## 2011-05-06 DIAGNOSIS — E119 Type 2 diabetes mellitus without complications: Secondary | ICD-10-CM

## 2011-05-06 DIAGNOSIS — M4640 Discitis, unspecified, site unspecified: Secondary | ICD-10-CM

## 2011-05-06 DIAGNOSIS — K750 Abscess of liver: Secondary | ICD-10-CM

## 2011-05-06 DIAGNOSIS — M519 Unspecified thoracic, thoracolumbar and lumbosacral intervertebral disc disorder: Secondary | ICD-10-CM

## 2011-05-06 DIAGNOSIS — M549 Dorsalgia, unspecified: Secondary | ICD-10-CM

## 2011-05-06 DIAGNOSIS — I10 Essential (primary) hypertension: Secondary | ICD-10-CM

## 2011-05-06 DIAGNOSIS — E785 Hyperlipidemia, unspecified: Secondary | ICD-10-CM

## 2011-05-06 DIAGNOSIS — M869 Osteomyelitis, unspecified: Secondary | ICD-10-CM

## 2011-05-06 LAB — CBC WITH DIFFERENTIAL/PLATELET
Basophils Absolute: 0 10*3/uL (ref 0.0–0.1)
Eosinophils Absolute: 0.1 10*3/uL (ref 0.0–0.7)
HCT: 33.6 % — ABNORMAL LOW (ref 36.0–46.0)
Hemoglobin: 11.4 g/dL — ABNORMAL LOW (ref 12.0–15.0)
Lymphs Abs: 2.1 10*3/uL (ref 0.7–4.0)
MCHC: 33.8 g/dL (ref 30.0–36.0)
MCV: 93.1 fl (ref 78.0–100.0)
Monocytes Absolute: 0.6 10*3/uL (ref 0.1–1.0)
Neutro Abs: 5 10*3/uL (ref 1.4–7.7)
RDW: 18.2 % — ABNORMAL HIGH (ref 11.5–14.6)

## 2011-05-06 LAB — HEPATIC FUNCTION PANEL
AST: 29 U/L (ref 0–37)
Alkaline Phosphatase: 39 U/L (ref 39–117)
Bilirubin, Direct: 0.1 mg/dL (ref 0.0–0.3)
Total Bilirubin: 0.7 mg/dL (ref 0.3–1.2)

## 2011-05-06 LAB — BASIC METABOLIC PANEL
BUN: 15 mg/dL (ref 6–23)
CO2: 28 mEq/L (ref 19–32)
Glucose, Bld: 118 mg/dL — ABNORMAL HIGH (ref 70–99)
Potassium: 4.5 mEq/L (ref 3.5–5.1)
Sodium: 141 mEq/L (ref 135–145)

## 2011-05-06 LAB — MICROALBUMIN / CREATININE URINE RATIO: Microalb, Ur: 20.4 mg/dL — ABNORMAL HIGH (ref 0.0–1.9)

## 2011-05-06 LAB — LIPID PANEL
HDL: 43.8 mg/dL (ref >39.00)
Total CHOL/HDL Ratio: 3

## 2011-05-06 MED ORDER — GLUCOSE BLOOD VI STRP: ORAL_STRIP | Status: DC

## 2011-05-06 MED ORDER — CYCLOBENZAPRINE HCL 10 MG PO TABS: 10.0000 mg | ORAL_TABLET | Freq: Three times a day (TID) | ORAL | Status: DC | PRN

## 2011-05-06 NOTE — Assessment & Plan Note (Signed)
Check labs today F/u lipid clinic

## 2011-05-06 NOTE — Patient Instructions (Signed)
Diabetes Meal Planning Guide The diabetes meal planning guide is a tool to help you plan your meals and snacks. It is important for people with diabetes to manage their blood sugar levels. Choosing the right foods and the right amounts throughout your day will help control your blood sugar. Eating right can even help you improve your blood pressure and reach or maintain a healthy weight. CARBOHYDRATE COUNTING MADE EASY When you eat carbohydrates, they turn to sugar (glucose). This raises your blood sugar level. Counting carbohydrates can help you control this level so you feel better. When you plan your meals by counting carbohydrates, you can have more flexibility in what you eat and balance your medicine with your food intake. Carbohydrate counting simply means adding up the total amount of carbohydrate grams (g) in your meals or snacks. Try to eat about the same amount at each meal. Foods with carbohydrates are listed below. Each portion below is 1 carbohydrate serving or 15 grams of carbohydrates. Ask your dietician how many grams of carbohydrates you should eat at each meal or snack. Grains and Starches 1 slice bread 1/2 English muffin or hotdog/hamburger bun 3/4 cup cold cereal (unsweetened) 1/3 cup cooked pasta or rice 1/2 cup starchy vegetables (corn, potatoes, peas, beans, winter squash) 1 tortilla (6 inches) 1/4 bagel 1 waffle or pancake (size of a CD) 1/2 cup cooked cereal 4 to 6 small crackers *Whole grain is recommended Fruit 1 cup fresh unsweetened berries, melon, papaya, pineapple 1 small fresh fruit 1/2 banana or mango 1/2 cup fruit juice (4 ounces unsweetened) 1/2 cup canned fruit in natural juice or water 2 tablespoons dried fruit 12 to 15 grapes or cherries Milk and Yogurt 1 cup fat-free or 1% milk 1 cup soy milk 6 ounces light yogurt with sugar-free sweetener 6 ounces low-fat soy yogurt 6 ounces plain yogurt Vegetables 1 cup raw or 1/2 cup cooked is counted as 0  carbohydrates or a "free" food. If you eat 3 or more servings at one meal, count them as 1 carbohydrate serving. Other Carbohydrates 3/4 ounces chips or pretzels 1/2 cup ice cream or frozen yogurt 1/4 cup sherbet or sorbet 2 inch square cake, no frosting 1 tablespoon honey, sugar, jam, jelly, or syrup 2 small cookies 3 squares of graham crackers 3 cups popcorn 6 crackers 1 cup broth-based soup Count 1 cup casserole or other mixed foods as 2 carbohydrate servings. Foods with less than 20 calories in a serving may be counted as 0 carbohydrates or a "free" food. You may want to purchase a book or computer software that lists the carbohydrate gram counts of different foods. In addition, the nutrition facts panel on the labels of the foods you eat are a good source of this information. The label will tell you how big the serving size is and the total number of carbohydrate grams you will be eating per serving. Divide this number by 15 to obtain the number of carbohydrate servings in a portion. Remember: 1 carbohydrate serving equals 15 grams of carbohydrate. SERVING SIZES Measuring foods and serving sizes helps you make sure you are getting the right amount of food. The list below tells how big or small some common serving sizes are.  1 ounce (oz) of cheese.................................4 stacked dice.   2 to 3 oz cooked meat..................................Deck of cards.   1 teaspoon (tsp)............................................Tip of little finger.   1 tablespoon (tbs).........................................Thumb.   2 tbs.............................................................Golf ball.    cup...........................................................Half of a fist.   1 cup............................................................A fist.    SAMPLE DIABETES MEAL PLAN Below is a sample meal plan that includes foods from the grain and starches, dairy, vegetable, fruit, and  meat groups. A dietician can individualize a meal plan to fit your calorie needs and tell you the number of servings needed from each food group. However, controlling the total amount of carbohydrates in your meal or snack is more important than making sure you include all of the food groups at every meal. You may interchange carbohydrate containing foods (dairy, starches, and fruits). The meal plan below is an example of a 2000 calorie diet using carbohydrate counting. This meal plan has 17 carbohydrate servings (carb choices). Breakfast 1 cup oatmeal (2 carb choices) 3/4 cup light yogurt (1 carb choice) 1 cup blueberries (1 carb choice) 1/4 cup almonds  Snack 1 large apple (2 carb choices) 1 low-fat string cheese stick  Lunch Chicken breast salad:  1 cup spinach   1/4 cup chopped tomatoes   2 oz chicken breast, sliced   2 tbs low-fat Italian dressing  12 whole-wheat crackers (2 carb choices) 12 to 15 grapes (1 carb choice) 1 cup low-fat milk (1 carb choice)  Snack 1 cup carrots 1/2 cup hummus (1 carb choice)  Dinner 3 oz broiled salmon 1 cup brown rice (3 carb choices)  Snack 1 1/2 cups steamed broccoli (1 carb choice) drizzled with 1 tsp olive oil and lemon juice 1 cup light pudding (2 carb choices)  DIABETES MEAL PLANNING WORKSHEET Your dietician can use this worksheet to help you decide how many servings of foods and what types of foods are right for you.  Breakfast Food Group and Servings Carb Choices Grain/Starches _______________________________________ Dairy ______________________________________________ Vegetable _______________________________________ Fruit _______________________________________________ Meat _______________________________________________ Fat _____________________________________________ Lunch Food Group and Servings Carb Choices Grain/Starches ________________________________________ Dairy _______________________________________________ Fruit  ________________________________________________ Meat ________________________________________________ Fat _____________________________________________ Dinner Food Group and Servings Carb Choices Grain/Starches ________________________________________ Dairy _______________________________________________ Fruit ________________________________________________ Meat ________________________________________________ Fat _____________________________________________ Snacks Food Group and Servings Carb Choices Grain/Starches ________________________________________ Dairy _______________________________________________ Vegetable ________________________________________ Fruit ________________________________________________ Meat ________________________________________________ Fat _____________________________________________ Daily Totals Starches _________________________ Vegetable __________________________ Fruit ______________________________ Dairy ______________________________ Meat ______________________________ Fat ________________________________  Document Released: 07/18/2005 Document Re-Released: 04/10/2010 [employer] Patient Information 2011 [employer]. 

## 2011-05-06 NOTE — Progress Notes (Signed)
  Subjective:    Patient ID: [name], female    DOB: 1935/01/31, 76 y.o.   MRN: [id number]  HPI Pt here f/u hospital for liver abscess and osteomyelitis.  Pt has f/u with ID 8/1.  Pt still has pain but is doing better.   Pt needs labs done as well.  Hospital records reviewed.     Review of Systems As above    Objective:   Physical Exam  Nursing note and vitals reviewed. Constitutional: She is oriented to person, place, and time. She appears well-developed and well-nourished.  Cardiovascular: Normal rate, regular rhythm and normal heart sounds.   No murmur heard. Pulmonary/Chest: Effort normal and breath sounds normal. No respiratory distress. She has no wheezes. She has no rales. She exhibits no tenderness.  Musculoskeletal: She exhibits no edema.       Pt c/o severe pain in back--no worse than previously and she was told by ID that this was normal Pt is in wheelchair.  Neurological: She is alert and oriented to person, place, and time. She displays normal reflexes. No cranial nerve deficit.  Skin: No rash noted. No erythema.  Psychiatric: She has a normal mood and affect. Her behavior is normal. Judgment and thought content normal.          Assessment & Plan:

## 2011-05-06 NOTE — Assessment & Plan Note (Signed)
Hold amaryl for now---glucose readings have been very low Check them qid

## 2011-05-06 NOTE — Assessment & Plan Note (Signed)
con't pain meds for now F/u ID

## 2011-05-06 NOTE — Assessment & Plan Note (Signed)
con't meds F/u 3 months

## 2011-05-06 NOTE — Assessment & Plan Note (Signed)
Finish abx per ID. ID notes reviewed.

## 2011-05-11 NOTE — Discharge Summary (Signed)
NAMESIMARA, [name]                    ACCOUNT NO.:  [id number]  MEDICAL RECORD NO.:  [id number]           PATIENT TYPE:  LOCATION:                                 FACILITY:  PHYSICIAN:  Zannie Cove, MD     DATE OF BIRTH:  [birth year]  DATE OF ADMISSION: DATE OF DISCHARGE:                              DISCHARGE SUMMARY   PRIMARY CARE PHYSICIAN:  Lelon Perla, DO  DISCHARGE DIAGNOSES: 1. L3-L4 diskitis/osteomyelitis. 2. Small liver abscess. 3. Transient microaerophilic strep bacteremia. 4. Septic portal vein thrombophlebitis, improving. 5. Diabetes. 6. Hypertension. 7. Normocytic anemia. 8. Dyslipidemia. 9. History of asthma. 10.History of degenerative disk disease. 11.Osteoporosis. 12.Gastroesophageal reflux disease. 13.History of upper gastrointestinal bleed. 14.History of grade 2 diastolic dysfunction, compensated.  CONSULTANTS: 1. Pulmonary Critical Care Medicine. 2. [facility] hospitalist. 3. Infectious Disease, Cliffton Asters, MD 4. General Surgery, Wilmon Arms. Corliss Skains, MD, and Lorne Skeens. Hoxworth, MD 5. Neurosurgery.  PROCEDURES/DIAGNOSTICS: 1. CT of the head, Mar 17, 2011, no acute intracranial abnormalities. 2. CT of the LS spine, findings compatible with progressive diskitis     and osteomyelitis, L3-L4. 3. MRA of the brain, Mar 19, 2011, showed intracranial atherosclerotic     changes and no acute infarcts, left cerebellar area with small     hemorrhagic breakdown products, maybe related to prior hemorrhagic     ischemia or small cavernoma, mild atrophy, small vessel disease     type changes. 4. MRI of the LS spine progressive destruction of the L3-L4 disk space     and adjacent endplates, related to progressive diskitis,     osteomyelitis.  L3-L4 moderate spinal stenosis and bilateral     foraminal narrowing. 5. CT of the chest, Mar 19, 2011, showed small pleural effusions,     bibasilar atelectasis. 6. CT abdomen and pelvis, Mar 19, 2011, thrombosis of  left portal vein     with a 2-cm abscess in the left lobe of the liver, maybe secondary     to septic thrombophlebitis of the portal vein, diskitis, and     osteomyelitis of L3-L4, extensive abnormality of the pubic     symphysis, possible chronic infection. 7. Repeat CT abdomen and pelvis, Mar 28, 2011, no propagation of the     portal vein thrombosis.  There has been probable partial     recanalization of the thrombus, decreased size of the left hepatic     fluid collection consistent with improved abscess, resolved ascites     and pleural effusions, stable changes of diskitis and osteomyelitis     of L3-L4 and parasymphyseal abnormalities.  HOSPITAL COURSE:  [name] is a very pleasant 76 year old [place] female with history of chronic back pain presented to the hospital with worsening back pain and lethargy and slurred speech.  On initial evaluation, the Stroke Services was consulted and stroke and TIA were ruled out on further imaging studies.  Subsequently through her hospital stay found to have diskitis and osteomyelitis of L3-L4, transient bacteremia as well as a small liver abscess. 1. L3-L4 diskitis.  She was seen by Neurosurgery as  well as Infectious     Disease for this.  Neurosurgery recommended continuing antibiotics     per Infectious Disease and did not feel that surgical decompression     was required at this time.  This was done by Dr. Julio Sicks.  In     terms of antibiotics, she did have transient strep bacteremia and     was seen by Dr. Orvan Falconer from Infectious Disease who recommended     Levaquin and Flagyl for 6 weeks a total due to the fact that the     most liver abscesses would likely be polymicrobial in etiology.     Infectious Disease recommended followup with Dr. Orvan Falconer in 2     weeks. 2. Small liver abscess with septic portal vein thrombosis.  She was     seen by [facility] for this as well as Interventional     Radiology.  They all felt  that the abscess was too small to be     surgically or radiographically drained, hence recommended IV     antibiotics.  Repeat scans did show that her abscess had decreased     in size by almost half to less than 1 cm in size now, and there is     recanalization of the septic portal vein thrombophlebitis.     Initially, she was on IV heparin for the portal vein     thrombophlebitis, however, I discussed the rationale for this given     that it was most likely infectious in etiology with the surgeons as     well as Infectious Disease.  We all agreed that there was no     rationale for long-term anticoagulation in this case and stopped     the heparin after 1 week of therapy since thrombus had already     started to partially recanalize. 3. Low back pain with spasms secondary to diskitis and osteomyelitis.     The patient's pain has been relatively controlled with Percocet,     Flexeril, and Robaxin and will have home health PT at the time of     discharge and also will follow up with Infectious Disease, at which     time they could consider repeat MRI of LS spine to make sure that     the diskitis and osteomyelitis are continuing to improve on the     antibiotic course. Rest of her chronic medical problems remained stable.  DISCHARGE CONDITION:  Temperature is 98.2, pulse 76, blood pressure 108/71, respirations 18, satting 95% on room air.  DISCHARGE LABORATORY DATA:  White count of 8.5, hemoglobin 8.1, platelets 298.  Chemistry:  Sodium 139, potassium 3.8, chloride 106, bicarb 22, BUN 13, creatinine 1.0.  DISCHARGE FOLLOWUP: 1. Dr. Loreen Freud in 7-10 days. 2. Dr. Cliffton Asters, Infectious Disease, in 2 weeks.     Zannie Cove, MD     PJ/MEDQ  D:  04/02/2011  T:  04/02/2011  Job:  [id number]  cc:   Lelon Perla, DO Cliffton Asters, M.D.  Electronically Signed by Zannie Cove  on 05/11/2011 12:47:23 PM

## 2011-05-14 ENCOUNTER — Other Ambulatory Visit: Payer: Self-pay | Admitting: Family Medicine

## 2011-05-20 ENCOUNTER — Inpatient Hospital Stay (HOSPITAL_COMMUNITY)
Admission: EM | Admit: 2011-05-20 | Discharge: 2011-05-28 | DRG: 552 | Disposition: A | Discharge status: Home Health | Payer: Medicare HMO | Attending: Internal Medicine | Admitting: Internal Medicine

## 2011-05-20 ENCOUNTER — Emergency Department (HOSPITAL_COMMUNITY): Payer: Medicare HMO

## 2011-05-20 ENCOUNTER — Telehealth: Payer: Self-pay | Admitting: *Deleted

## 2011-05-20 DIAGNOSIS — I519 Heart disease, unspecified: Secondary | ICD-10-CM | POA: Diagnosis present

## 2011-05-20 DIAGNOSIS — E785 Hyperlipidemia, unspecified: Secondary | ICD-10-CM | POA: Diagnosis present

## 2011-05-20 DIAGNOSIS — E119 Type 2 diabetes mellitus without complications: Secondary | ICD-10-CM | POA: Diagnosis present

## 2011-05-20 DIAGNOSIS — R5381 Other malaise: Secondary | ICD-10-CM | POA: Diagnosis present

## 2011-05-20 DIAGNOSIS — N390 Urinary tract infection, site not specified: Secondary | ICD-10-CM | POA: Diagnosis present

## 2011-05-20 DIAGNOSIS — M519 Unspecified thoracic, thoracolumbar and lumbosacral intervertebral disc disorder: Principal | ICD-10-CM | POA: Diagnosis present

## 2011-05-20 DIAGNOSIS — I10 Essential (primary) hypertension: Secondary | ICD-10-CM | POA: Diagnosis present

## 2011-05-20 DIAGNOSIS — Z8673 Personal history of transient ischemic attack (TIA), and cerebral infarction without residual deficits: Secondary | ICD-10-CM

## 2011-05-20 DIAGNOSIS — G609 Hereditary and idiopathic neuropathy, unspecified: Secondary | ICD-10-CM | POA: Diagnosis present

## 2011-05-20 LAB — URINE MICROSCOPIC-ADD ON

## 2011-05-20 LAB — CBC
MCH: 31.3 pg (ref 26.0–34.0)
MCHC: 35.1 g/dL (ref 30.0–36.0)
MCV: 89 fL (ref 78.0–100.0)
Platelets: 287 10*3/uL (ref 150–400)
RBC: 4 MIL/uL (ref 3.87–5.11)
RDW: 16.4 % — ABNORMAL HIGH (ref 11.5–15.5)

## 2011-05-20 LAB — URINALYSIS, ROUTINE W REFLEX MICROSCOPIC
Glucose, UA: NEGATIVE mg/dL
Hgb urine dipstick: NEGATIVE
Protein, ur: 100 mg/dL — AB
Specific Gravity, Urine: 1.021 (ref 1.005–1.030)
pH: 6 (ref 5.0–8.0)

## 2011-05-20 LAB — COMPREHENSIVE METABOLIC PANEL
ALT: 7 U/L (ref 0–35)
AST: 25 U/L (ref 0–37)
Albumin: 3.4 g/dL — ABNORMAL LOW (ref 3.5–5.2)
CO2: 20 mEq/L (ref 19–32)
Calcium: 9.8 mg/dL (ref 8.4–10.5)
Creatinine, Ser: 0.98 mg/dL (ref 0.50–1.10)
GFR calc non Af Amer: 56 mL/min — ABNORMAL LOW (ref >60)
Sodium: 137 mEq/L (ref 135–145)
Total Protein: 7.7 g/dL (ref 6.0–8.3)

## 2011-05-20 LAB — SEDIMENTATION RATE: Sed Rate: 50 mm/hr — ABNORMAL HIGH (ref 0–22)

## 2011-05-20 LAB — DIFFERENTIAL
Basophils Relative: 0 % (ref 0–1)
Eosinophils Absolute: 0.1 10*3/uL (ref 0.0–0.7)
Eosinophils Relative: 1 % (ref 0–5)
Lymphs Abs: 2.8 10*3/uL (ref 0.7–4.0)
Monocytes Relative: 8 % (ref 3–12)
Neutrophils Relative %: 64 % (ref 43–77)

## 2011-05-20 NOTE — Telephone Encounter (Signed)
Pt daughter called to report that physical therapist advise her to contact PCP due to Pt symptoms. The Pt is currently experiencing some, nausea, dry vomiting, weakness in legs from knee down along with elevated BP.Pt daughter note that Pt was able to participate in therapy before but today Pt is having weakness in legs to the ponit it is very difficulty to walk or stand with out lost of balance. Pt denies any numbness or loss of feeling in legs. Pt daughter advise ED, Pt daughter ok.

## 2011-05-20 NOTE — Telephone Encounter (Signed)
Agree--check on pt later

## 2011-05-21 DIAGNOSIS — M519 Unspecified thoracic, thoracolumbar and lumbosacral intervertebral disc disorder: Secondary | ICD-10-CM

## 2011-05-21 LAB — GLUCOSE, CAPILLARY
Glucose-Capillary: 119 mg/dL — ABNORMAL HIGH (ref 70–99)
Glucose-Capillary: 133 mg/dL — ABNORMAL HIGH (ref 70–99)

## 2011-05-21 LAB — URINE CULTURE
Colony Count: NO GROWTH
Culture: NO GROWTH

## 2011-05-21 LAB — BASIC METABOLIC PANEL
BUN: 13 mg/dL (ref 6–23)
CO2: 24 mEq/L (ref 19–32)
Calcium: 9.1 mg/dL (ref 8.4–10.5)
Chloride: 105 mEq/L (ref 96–112)
Creatinine, Ser: 0.91 mg/dL (ref 0.50–1.10)

## 2011-05-21 LAB — CBC
Hemoglobin: 10.6 g/dL — ABNORMAL LOW (ref 12.0–15.0)
MCH: 30.7 pg (ref 26.0–34.0)
MCHC: 34.1 g/dL (ref 30.0–36.0)
MCV: 90.1 fL (ref 78.0–100.0)
Platelets: 221 10*3/uL (ref 150–400)
RBC: 3.45 MIL/uL — ABNORMAL LOW (ref 3.87–5.11)

## 2011-05-21 LAB — DIFFERENTIAL
Eosinophils Absolute: 0.1 10*3/uL (ref 0.0–0.7)
Lymphs Abs: 1.7 10*3/uL (ref 0.7–4.0)
Monocytes Absolute: 0.9 10*3/uL (ref 0.1–1.0)
Monocytes Relative: 11 % (ref 3–12)
Neutrophils Relative %: 66 % (ref 43–77)

## 2011-05-21 LAB — C-REACTIVE PROTEIN: CRP: 0.3 mg/dL — ABNORMAL LOW (ref <0.6)

## 2011-05-21 NOTE — Telephone Encounter (Signed)
They can ask for 2nd opinion while she is in hospital but if it is so bad she had to be admitted she probably does need surgey.

## 2011-05-21 NOTE — Telephone Encounter (Signed)
They can ask for 2nd opinion while she is in hosptial but if has gotten that bad she probably does need surgery.

## 2011-05-21 NOTE — Telephone Encounter (Signed)
Pt is currently admitted to hosp on ortho floor. Pt daughter notes that Pt is numb from knee down and her stenosis has change and alter which causing squeezing on disc 2 and 3. The physician(Dr Dutch Quint) is suggestion surgery but Pt daughter notes that they would like to look into other alternative before going ahead with surgery.

## 2011-05-22 LAB — BASIC METABOLIC PANEL
BUN: 10 mg/dL (ref 6–23)
CO2: 23 mEq/L (ref 19–32)
Calcium: 9.2 mg/dL (ref 8.4–10.5)
Creatinine, Ser: 0.75 mg/dL (ref 0.50–1.10)
Glucose, Bld: 131 mg/dL — ABNORMAL HIGH (ref 70–99)

## 2011-05-22 LAB — CBC
Hemoglobin: 10.5 g/dL — ABNORMAL LOW (ref 12.0–15.0)
MCHC: 34 g/dL (ref 30.0–36.0)
RBC: 3.42 MIL/uL — ABNORMAL LOW (ref 3.87–5.11)
WBC: 7.7 10*3/uL (ref 4.0–10.5)

## 2011-05-22 LAB — GLUCOSE, CAPILLARY: Glucose-Capillary: 132 mg/dL — ABNORMAL HIGH (ref 70–99)

## 2011-05-22 NOTE — Telephone Encounter (Signed)
Discuss with patient daughter 

## 2011-05-23 DIAGNOSIS — M519 Unspecified thoracic, thoracolumbar and lumbosacral intervertebral disc disorder: Secondary | ICD-10-CM

## 2011-05-23 LAB — GLUCOSE, CAPILLARY
Glucose-Capillary: 152 mg/dL — ABNORMAL HIGH (ref 70–99)
Glucose-Capillary: 126 mg/dL — ABNORMAL HIGH (ref 70–99)
Glucose-Capillary: 132 mg/dL — ABNORMAL HIGH (ref 70–99)

## 2011-05-23 NOTE — H&P (Signed)
NAMELESLE, Lori NO.:  [id number]  MEDICAL RECORD NO.:  [id number]  LOCATION:  5014                         FACILITY:  [facility]  PHYSICIAN:  Candelaria Celeste, DO      DATE OF BIRTH:  January 30, 1935  DATE OF ADMISSION:  05/20/2011 DATE OF DISCHARGE:                             HISTORY & PHYSICAL   PRIMARY CARE PROVIDER:  [facility] [place].  PRIMARY NEUROLOGIST:  Kathaleen Maser. Pool, MD  PRIMARY INFECTIOUS DISEASE PHYSICIAN:  Lacretia Leigh. Ninetta Lights, MD  CHIEF COMPLAINT:  Increasing numbness and weakness in lower extremities.  HISTORY OF PRESENT ILLNESS:  Ms. [name] is a 76 year old female with a history of osteomyelitis with diskitis on L3-L4 which was diagnosed in May 2012, diabetes type 2, hypertension, hyperlipidemia who presents with approximately 5 days history of increasing weakness that began in her feet bilaterally and has gradually increased over the past 5 days. She has also developed increasing weakness and has gotten to the point where she has a difficult time walking him with assistance with a walker.  She states that the numbness is more on the medial aspects of her lower legs but has some milder numbness on the lateral aspects of her lower extremities.  The numbness stops approximately at her knees. She denies fevers, nausea, or vomiting, though has decreased appetite and mild chills which have begun over the past 5 days.  Her decreased appetite has remained since her last hospitalization in May.  She denies headache, vision changes, or increasing back pain.  She also denies dysuria, frequency, or pelvic pressure.  PAST MEDICAL HISTORY: 1. Diabetes type 2. 2. Hypertension. 3. L3-L4 diskitis/osteomyelitis. 4. Hyperlipidemia. 5. The patient has had a stroke in the past.  PAST SURGICAL HISTORY:  The patient is status post appendectomy and has had 2 C-sections and lumbar spinal fusion with revision.  MEDICATIONS: 1. A number 1-5 joules 500 mg  t.i.d. 2. Levaquin 500 mg daily. 3. Flaxseed oil. 4. Fish oil 1000 mg 3 times a day. 5. Calcium carbonate 500 mg twice daily. 6. Atenolol 100 mg daily. 7. Advair Diskus 250/50 two puffs daily as needed. 8. Multivitamin. 9. Percocet 5/325 one tablet every 6 hours as needed for pain. 10.Robaxin 500 mg one tablet every 6 hours as needed. 11.TriCor 54 mg daily. 12.Vitamin C 500 mg daily. 13.Vitamin D3 1000 units daily. 14.Vitamin E 100 units daily. 15.Vytorin 10/20 one tablet daily.  ALLERGIES:  No known drug allergies.  FAMILY HISTORY:  There is a family history of coronary artery disease in the father and alcoholism in the mother.  Otherwise, she is unaware of any other medical problems.  SOCIAL HISTORY:  The patient lives with her daughter, son-in-law, and two grandchildren.  She used to work as a Hydrographic surveyor. She used to smoke until 1999 and has quit since then.  She denies alcohol use or illicit drug use.  REVIEW OF SYSTEMS: A 10 system review of systems was preformed and is  negative other than as stated in the HPI.  PHYSICAL EXAMINATION:  VITAL SIGNS:  Temperature 98.7, heart rate 87, blood pressure 148/76, and respiratory rate 18. GENERAL:  This is an [place] female who is awake, alert, and oriented x3 in no acute distress. HEENT:  Head normocephalic and atraumatic.  Pupils equal, round, and reactive to light.  Extraocular muscles are intact.  Sclerae are anicteric and noninjected.  Examination of the oropharynx shows nonerythematous and nonenlarged tonsils. NECK:  Supple without lymphadenopathy. LUNGS:  Clear to auscultation bilaterally.  No wheezes, rales, or rhonchi.  Good rib movement. CARDIAC:  Regular rate with normal S1-S2 sounds with no murmurs auscultated. ABDOMEN:  Soft, nontender, nondistended with appropriate bowel sounds. There is no hepatosplenomegaly.  There is no pelvic pressure. VASCULAR:  2+ dorsalis pedis and radial pulses  bilaterally. NEUROLOGIC:  Cranial nerves II-XII are intact.  There is no peripheral ataxia present with heel-to-shin test.  Strength in the lower extremities are 4/5 bilaterally in all muscle groups.  Strength in the upper extremities are 3/5 in the right to grip and to bicipital flexion. There is 4/5 in other areas.  Her left upper extremities are 4/5.  DTRs are 2/4. SKIN:  There are no rashes, bruises, or petechiae.  The skin over the spinal area is intact with no erythema. MUSCULOSKELETAL:  There is tenderness to the L3-L4 spinous process area.  LABORATORY DATA: 1. A CMP was drawn which was normal. 2. A CBC was drawn which showed a white count 10.3.     Hemoglobin/hematocrit and platelets were normal.  There is 64%     neutrophils and an ANC of 6.6. 3. Inflammatory markers:  CRP was 0.3 and the ESR was 50. 4. Urinalysis was drawn which shows a specific gravity of 1.02,     negative blood, positive nitrates and trace leukocytes.  There is     rare bacteria seen on micro. 5. A MRI was performed with results still pending.  Per the ER     physician who spoke with the radiologist with evidence of increased     osteomyelitis diskitis on L3-L4 as well as spinal stenosis which     was new from her previous exam.  The images and radiology report     are not available to me at this time.  IMPRESSION AND PLAN: 1. Weakness with numbness.  I am unsure of the exact etiology of this     weakness at this time, though this may be contributed to the spinal     stenosis in part.  This does appear more significant in the L4     dermatomal region.  We will consult Neurology in the morning,     particularly given the patient's past history of diskitis and     osteomyelitis. 2. L3-L4 diskitis and osteomyelitis.  We will consult Infectious     Disease in the morning for input as to the appropriate management     of this patient's infection. 3. Urinary tract infection.  We will send the urine to  culture and     continue the patient on ceftriaxone 1 gram every 24 hours IV. 4. Diabetes type 2.  Though the patient has history of diabetes in the     last hospitalization, as in the outpatient she has had multiple low     blood sugars.  We will check the patient's blood sugars before     meals and at bedtime and administer insulin if necessary. 5. Hypertension.  We will continue the patient's outpatient     medications. 6. Pain.  We will continue the patient's pain management as an  outpatient.  The patient is full code.  DVT prophylaxis.  We will provide the patient with Arixtra to prevent venous thromboembolism.  TIME SPENT:  40 minutes were spent for this admission.          ______________________________ Candelaria Celeste, DO     JS/MEDQ  D:  05/21/2011  T:  05/21/2011  Job:  [id number]  cc:   [facility]  Electronically Signed by Candelaria Celeste DO on 05/23/2011 09:39:19 PM

## 2011-05-24 LAB — GLUCOSE, CAPILLARY
Glucose-Capillary: 105 mg/dL — ABNORMAL HIGH (ref 70–99)
Glucose-Capillary: 186 mg/dL — ABNORMAL HIGH (ref 70–99)
Glucose-Capillary: 196 mg/dL — ABNORMAL HIGH (ref 70–99)

## 2011-05-24 LAB — CBC
Hemoglobin: 9.9 g/dL — ABNORMAL LOW (ref 12.0–15.0)
MCH: 30.8 pg (ref 26.0–34.0)
MCHC: 34.5 g/dL (ref 30.0–36.0)

## 2011-05-24 LAB — CULTURE, BLOOD (ROUTINE X 2)

## 2011-05-25 LAB — GLUCOSE, CAPILLARY
Glucose-Capillary: 128 mg/dL — ABNORMAL HIGH (ref 70–99)
Glucose-Capillary: 124 mg/dL — ABNORMAL HIGH (ref 70–99)

## 2011-05-26 LAB — GLUCOSE, CAPILLARY: Glucose-Capillary: 128 mg/dL — ABNORMAL HIGH (ref 70–99)

## 2011-05-27 LAB — GLUCOSE, CAPILLARY
Glucose-Capillary: 132 mg/dL — ABNORMAL HIGH (ref 70–99)
Glucose-Capillary: 109 mg/dL — ABNORMAL HIGH (ref 70–99)

## 2011-05-27 LAB — CULTURE, BLOOD (ROUTINE X 2)
Culture  Setup Time: 201207172009
Culture: NO GROWTH

## 2011-05-28 LAB — GLUCOSE, CAPILLARY
Glucose-Capillary: 165 mg/dL — ABNORMAL HIGH (ref 70–99)
Glucose-Capillary: 206 mg/dL — ABNORMAL HIGH (ref 70–99)

## 2011-05-31 ENCOUNTER — Telehealth: Payer: Self-pay | Admitting: Family Medicine

## 2011-05-31 NOTE — Telephone Encounter (Signed)
[name] would like to have verbal order for services 1 time a week for 1 week, 2 times a week for 3 weeks for pain management, home safety, balance, gait, and transfer.  Verbal order given for them to continue services

## 2011-06-05 ENCOUNTER — Encounter: Payer: Self-pay | Admitting: Internal Medicine

## 2011-06-05 ENCOUNTER — Ambulatory Visit (INDEPENDENT_AMBULATORY_CARE_PROVIDER_SITE_OTHER): Payer: Medicare HMO | Admitting: Internal Medicine

## 2011-06-05 DIAGNOSIS — G589 Mononeuropathy, unspecified: Secondary | ICD-10-CM

## 2011-06-05 DIAGNOSIS — G629 Polyneuropathy, unspecified: Secondary | ICD-10-CM | POA: Insufficient documentation

## 2011-06-05 DIAGNOSIS — K750 Abscess of liver: Secondary | ICD-10-CM

## 2011-06-05 DIAGNOSIS — M519 Unspecified thoracic, thoracolumbar and lumbosacral intervertebral disc disorder: Secondary | ICD-10-CM

## 2011-06-05 DIAGNOSIS — M4640 Discitis, unspecified, site unspecified: Secondary | ICD-10-CM

## 2011-06-05 NOTE — Progress Notes (Signed)
  Subjective:    Patient ID: [name], female    DOB: 1935-02-19, 76 y.o.   MRN: [id number]  HPI Mrs. Difrancesco is in for hospital followup visit with her daughter. She was recently hospitalized with lethargy, nausea, anorexia, and increasing weakness and numbness in her feet. She was at the end of her second month of antibiotic therapy with Levaquin and Flagyl for her liver abscess and vertebral infection. I felt like her infections were probably cured and that she might be having some peripheral neuropathy due to the Flagyl so I stopped her antibiotics. There was also concern that her weakness and tingling in her legs might be related to her progressive spinal stenosis. She was discharged home recently and has had improvement in her appetite and strength. She is walking much farther with the aid of a rolling walker and is working with a physical therapist twice weekly. She still has some numbness and tingling in her lower extremities and has recently been having some pain in the bottom of her feet. She also has had some difficulty knowing if she needed to have a bowel movement or pass her urine and has been trying to go to the bathroom every 2 hours to compensate for that. Her back pain is back near her chronic baseline having improved significantly over the last month.    Review of Systems     Objective:   Physical Exam  Constitutional: She appears well-nourished. No distress.       She looks much better than when I last saw her 2 weeks ago.  HENT:  Mouth/Throat: Oropharynx is clear and moist. No oropharyngeal exudate.  Abdominal: Soft. Bowel sounds are normal. She exhibits no distension. There is no tenderness.  Neurological:       Her sensation is intact to light touch in her lower extremities. She walks slowly but otherwise within normal gait.          Assessment & Plan:

## 2011-06-05 NOTE — Assessment & Plan Note (Signed)
Her liver abscess is almost certainly cured. I will observe her off antibiotics.

## 2011-06-05 NOTE — Assessment & Plan Note (Signed)
I think that her vertebral infection is probably cured but will need to continue to observe her closely off of antibiotics to be certain.

## 2011-06-06 NOTE — Discharge Summary (Signed)
NAMEMARDA, [name] NO.:  [id number]  MEDICAL RECORD NO.:  [id number]  LOCATION:  5014                         FACILITY:  [facility]  PHYSICIAN:  Zannie Cove, MD     DATE OF BIRTH:  [birth year]  DATE OF ADMISSION:  05/20/2011 DATE OF DISCHARGE:  05/27/2011                        DISCHARGE SUMMARY - REFERRING   PRIMARY CARE PHYSICIAN:  Lelon Perla, DO  INFECTIOUS DISEASE PHYSICIAN:  Lacretia Leigh. Ninetta Lights, M.D.  NEUROSURGEON:  Henry A. Pool, M.D.  DISCHARGE DIAGNOSES: 1. Severe peripheral neuropathy. 2. Spinal stenosis at L3-L4. 3. Recent osteomyelitis/diskitis at L3-L4. 4. Type 2 diabetes. 5. Dyslipidemia. 6. Deconditioning. 7. History of diastolic dysfunction, compensated. 8. History of her GI bleed. 9. Gastroesophageal reflux disease. 10.Osteoporosis. 11.Hypertension.  DISCHARGE MEDICATIONS: 1. Colace 100 mg p.o. b.i.d. p.r.n. 2. Ambien 5 mg p.o. at bedtime p.r.n. 3. Advair Diskus 250/50 two puffs daily as needed. 4. Tylenol 100 mg p.o. daily. 5. Calcium carbonate 500 mg p.o. b.i.d. 6. Fish oil 1000 mg capsule three times a day. 7. Flaxseed oil over-the-counter 1 capsule daily. 8. Multivitamin over-the-counter 1 tablet daily. 9. Percocet 5/325 1 tablet q.6 h. p.r.n. 10.Robaxin 500 mg p.o. q.6 h. p.r.n. 11.TriCor 54 mg daily. 12.Vitamin C 500 mg over-the-counter once daily. 13.Vitamin D 1000 units 1 capsule daily. 14.Vitamin E 400 units 1 tablet daily. 15.Vytorin 10/20 one tablet every 4 days.  CONSULTANTS: 1. Henry A. Pool, M.D., Neurosurgery. 2. Lacretia Leigh. Ninetta Lights, M.D., Infectious Disease.  DIAGNOSTIC/INVESTIGATION:  MRI of LS spine showed slightly increased fluid density of the L3-L4 intravertebral disk compatible with diskitis. The appearance is stable.  No significant change in the degree of impingement.  HOSPITAL COURSE:  [name] is a very pleasant 76 year old female who presented to the hospital with increasing numbness and  weakness in her lower extremity.  Of note, she had been treated for L3-L4 diskitis/osteomyelitis with almost completed an 7-week course of the antibiotics when she presented to the hospital.  As part of her evaluation, she had an MRI which showed that her diskitis/osteomyelitis was stable and she was seen by Dr. Jordan Likes as well as Dr. Orvan Falconer with Infectious Disease.  In terms of the etiology of her neuropathy which is primarily peripheral, both lower extremities from knees downward causing significant debility, inability to ambulate, etc.  The etiology was felt to be related to spinal stenosis and also contributed by long-term Flagyl use which she had taken close to 8 weeks.  Obviously, the Flagyl was discontinued along with levofloxacin since she has completed her antibiotic course for the osteomyelitis and at this point she has been 5 days off the antibiotics and Dr. Jordan Likes, Neurosurgery as an following her with plans to potentially consider lumbar spinal decompression within the next couple of weeks.  Rest of her chronic medical problems remained stable.  She will be discharged to a skilled nursing facility for rehabilitation.  DISCHARGE FOLLOWUP: 1. Lelon Perla, DO  in one week. 2. Kathaleen Maser. Pool, M.D. in two weeks.     Zannie Cove, MD     PJ/MEDQ  D:  05/27/2011  T:  05/27/2011  Job:  [id number]  Electronically  Signed by Zannie Cove  on 06/06/2011 04:41:54 PM

## 2011-06-19 ENCOUNTER — Ambulatory Visit (INDEPENDENT_AMBULATORY_CARE_PROVIDER_SITE_OTHER): Payer: Medicare HMO | Admitting: Internal Medicine

## 2011-06-19 ENCOUNTER — Encounter: Payer: Self-pay | Admitting: Internal Medicine

## 2011-06-19 DIAGNOSIS — G589 Mononeuropathy, unspecified: Secondary | ICD-10-CM

## 2011-06-19 DIAGNOSIS — K750 Abscess of liver: Secondary | ICD-10-CM

## 2011-06-19 DIAGNOSIS — R634 Abnormal weight loss: Secondary | ICD-10-CM

## 2011-06-19 DIAGNOSIS — M519 Unspecified thoracic, thoracolumbar and lumbosacral intervertebral disc disorder: Secondary | ICD-10-CM

## 2011-06-19 DIAGNOSIS — G629 Polyneuropathy, unspecified: Secondary | ICD-10-CM

## 2011-06-19 DIAGNOSIS — M4640 Discitis, unspecified, site unspecified: Secondary | ICD-10-CM

## 2011-06-19 NOTE — Assessment & Plan Note (Signed)
I do not have an explanation for her significant weight loss. I talked to her daughter about be more diligent in having her eat more frequent meals and snacks. She will also buy a scale and weigh herself daily.

## 2011-06-19 NOTE — Progress Notes (Signed)
  Subjective:    Patient ID: [name], female    DOB: [birth year], 76 y.o.   MRN: [id number]  HPI [name] is in with her daughter today for her routine appointment. Her back pain continues to improve and now is actually usually a little less than it was when she was suffering from her chronic back pain last year before her vertebral infection began. She has been working with physical therapy and her strength is better. She cannot get out of bed and stand on her own but she has to use a walker for ambulation. However her daughter states that her endurance is actually worse and she says that she is having more numbness in her feet. She states that her appetite has improved but she still has to force herself to eat. She has not had any nausea, vomiting, constipation, or diarrhea. She has not had any fever, chills, or sweats. She still states that she is unable to the since when she needs to pass her urine or have a bowel movement. She is not incontinent. She states that she will simply go to the bathroom frequently and sit on the toilet so that she does not have an accident.    Review of Systems     Objective:   Physical Exam  Constitutional: No distress.       She has lost 14 more pounds down to 107. She weighed about 150 pounds which is her usual adult weight last February.  She is seated in her wheelchair.  HENT:  Mouth/Throat: Oropharynx is clear and moist. No oropharyngeal exudate.  Cardiovascular: Normal rate, regular rhythm and normal heart sounds.   No murmur heard. Pulmonary/Chest: Effort normal and breath sounds normal. She has no wheezes. She has no rales.  Abdominal: Soft. Bowel sounds are normal. She exhibits no distension and no mass. There is no tenderness.  Neurological:       Sensation in her lower extremities is intact to light touch.          Assessment & Plan:

## 2011-06-19 NOTE — Assessment & Plan Note (Signed)
I strongly suspect that her vertebral infection has been cured. I will continue observation off of antibiotics

## 2011-06-19 NOTE — Assessment & Plan Note (Signed)
I strongly suspect that her liver abscess this cured. I will observe her off of antibiotics.

## 2011-06-19 NOTE — Assessment & Plan Note (Signed)
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 She is scheduled to see Dr. Renae Fickle, her neurosurgeon, in one week.

## 2011-06-28 ENCOUNTER — Ambulatory Visit: Payer: Medicare HMO | Admitting: Family Medicine

## 2011-07-03 ENCOUNTER — Ambulatory Visit (INDEPENDENT_AMBULATORY_CARE_PROVIDER_SITE_OTHER): Payer: Medicare HMO | Admitting: Family Medicine

## 2011-07-03 ENCOUNTER — Encounter: Payer: Self-pay | Admitting: Family Medicine

## 2011-07-03 VITALS — BP 132/88 | HR 60 | Temp 97.9°F | Wt 121.4 lb

## 2011-07-03 DIAGNOSIS — E119 Type 2 diabetes mellitus without complications: Secondary | ICD-10-CM

## 2011-07-03 DIAGNOSIS — M48061 Spinal stenosis, lumbar region without neurogenic claudication: Secondary | ICD-10-CM

## 2011-07-03 DIAGNOSIS — E785 Hyperlipidemia, unspecified: Secondary | ICD-10-CM

## 2011-07-03 NOTE — Progress Notes (Signed)
  Subjective:    Patient ID: [name], female    DOB: 11-08-1934, 76 y.o.   MRN: [id number]  HPI Pt here with concern about weight loss.  Dr Orvan Falconer was under impression she lost 20 lbs but according to our scale she has lost 4 lbs since last visit 1 month ago.  Pt is drinkin one glucerna a day and I full meal in evening.  She has yogurt in am and small lunch as well.       Review of Systems As above    Objective:   Physical Exam  Constitutional: She is oriented to person, place, and time. She appears well-developed and well-nourished.  Cardiovascular: Normal rate, regular rhythm and normal heart sounds.   No murmur heard. Pulmonary/Chest: Effort normal and breath sounds normal. No respiratory distress. She has no wheezes. She has no rales. She exhibits no tenderness.  Neurological: She is alert and oriented to person, place, and time.  Psychiatric: She has a normal mood and affect. Her behavior is normal. Thought content normal.          Assessment & Plan:  1 ? Weight loss---only 4 lbs , not 20 lbs as originally thought---take glucerna tid         rto 2 weeks 2.  Diabetes---check labs next week---pt off meds 3, hyperlipidemia---per lipid clinic 4. HTN--  Stable, con't meds

## 2011-07-10 ENCOUNTER — Other Ambulatory Visit: Payer: Medicare HMO

## 2011-07-10 DIAGNOSIS — E785 Hyperlipidemia, unspecified: Secondary | ICD-10-CM

## 2011-07-15 ENCOUNTER — Ambulatory Visit: Payer: Medicare HMO

## 2011-07-15 DIAGNOSIS — E785 Hyperlipidemia, unspecified: Secondary | ICD-10-CM

## 2011-07-15 DIAGNOSIS — E119 Type 2 diabetes mellitus without complications: Secondary | ICD-10-CM

## 2011-07-15 LAB — HEPATIC FUNCTION PANEL
ALT: 31 U/L (ref 0–35)
AST: 41 U/L — ABNORMAL HIGH (ref 0–37)
Alkaline Phosphatase: 59 U/L (ref 39–117)
Bilirubin, Direct: 0.1 mg/dL (ref 0.0–0.3)
Total Protein: 7.9 g/dL (ref 6.0–8.3)

## 2011-07-15 LAB — CBC WITH DIFFERENTIAL/PLATELET
Basophils Absolute: 0 10*3/uL (ref 0.0–0.1)
Basophils Relative: 0.5 % (ref 0.0–3.0)
Eosinophils Absolute: 0.2 10*3/uL (ref 0.0–0.7)
HCT: 39.1 % (ref 36.0–46.0)
Hemoglobin: 13.1 g/dL (ref 12.0–15.0)
Lymphs Abs: 2.3 10*3/uL (ref 0.7–4.0)
MCHC: 33.4 g/dL (ref 30.0–36.0)
Neutro Abs: 4.2 10*3/uL (ref 1.4–7.7)
RDW: 13.9 % (ref 11.5–14.6)

## 2011-07-15 LAB — BASIC METABOLIC PANEL
CO2: 27 mEq/L (ref 19–32)
Chloride: 105 mEq/L (ref 96–112)
Glucose, Bld: 121 mg/dL — ABNORMAL HIGH (ref 70–99)
Potassium: 3.9 mEq/L (ref 3.5–5.1)
Sodium: 142 mEq/L (ref 135–145)

## 2011-07-17 ENCOUNTER — Encounter: Payer: Self-pay | Admitting: Family Medicine

## 2011-07-17 ENCOUNTER — Ambulatory Visit (INDEPENDENT_AMBULATORY_CARE_PROVIDER_SITE_OTHER): Payer: Medicare HMO | Admitting: Family Medicine

## 2011-07-17 DIAGNOSIS — E785 Hyperlipidemia, unspecified: Secondary | ICD-10-CM

## 2011-07-17 DIAGNOSIS — I10 Essential (primary) hypertension: Secondary | ICD-10-CM

## 2011-07-17 DIAGNOSIS — R634 Abnormal weight loss: Secondary | ICD-10-CM

## 2011-07-17 NOTE — Assessment & Plan Note (Signed)
Doing well with diet rto 3 months or sooner prn

## 2011-07-17 NOTE — Progress Notes (Signed)
  Subjective:    Patient ID: [name], female    DOB: 10-31-1935, 76 y.o.   MRN: [id number]  HPI Pt here to f/u on weight.  Pt is eating more and drinking shakes 1-2 x a day.  Pt states she feels much better.     Review of Systems As above    Objective:   Physical Exam  Constitutional: She appears well-developed and well-nourished.  Cardiovascular: Normal rate and regular rhythm.   Pulmonary/Chest: Effort normal and breath sounds normal.  Psychiatric: She has a normal mood and affect. Her behavior is normal. Judgment and thought content normal.          Assessment & Plan:

## 2011-07-23 ENCOUNTER — Ambulatory Visit: Payer: Medicare HMO | Admitting: Internal Medicine

## 2011-08-07 ENCOUNTER — Other Ambulatory Visit: Payer: Self-pay | Admitting: Family Medicine

## 2011-08-07 DIAGNOSIS — M4620 Osteomyelitis of vertebra, site unspecified: Secondary | ICD-10-CM

## 2011-08-07 NOTE — Telephone Encounter (Signed)
Last seen 07/17/11 and filled 6/27/2 by Dr.Campbell. Please advise    KP

## 2011-08-08 MED ORDER — OXYCODONE-ACETAMINOPHEN 5-325 MG PO TABS: 1.0000 | ORAL_TABLET | Freq: Four times a day (QID) | ORAL | Status: DC | PRN

## 2011-08-08 NOTE — Telephone Encounter (Signed)
Family member made aware Rx ready for pick up     KP

## 2011-08-09 ENCOUNTER — Ambulatory Visit (INDEPENDENT_AMBULATORY_CARE_PROVIDER_SITE_OTHER): Payer: Medicare HMO | Admitting: *Deleted

## 2011-08-09 DIAGNOSIS — Z23 Encounter for immunization: Secondary | ICD-10-CM

## 2011-08-12 ENCOUNTER — Ambulatory Visit (INDEPENDENT_AMBULATORY_CARE_PROVIDER_SITE_OTHER): Payer: Medicare HMO | Admitting: Internal Medicine

## 2011-08-12 DIAGNOSIS — M4640 Discitis, unspecified, site unspecified: Secondary | ICD-10-CM

## 2011-08-12 DIAGNOSIS — M519 Unspecified thoracic, thoracolumbar and lumbosacral intervertebral disc disorder: Secondary | ICD-10-CM

## 2011-08-12 NOTE — Progress Notes (Signed)
  Subjective:    Patient ID: Kaislyn Gulas, female    DOB: 20-Aug-1935, 76 y.o.   MRN: [id number]  HPI Ms. Niebla is in for her routine visit. She is now been off of all antibiotics for nearly 3 months and is feeling better. She's not had any fever, chills, or sweats. Her back pain has improved and she has been pleased that on some days recently she's been able to get by without taking any Percocet until just before bedtime. She still struggles with some loss of sensation in her fingertips and feet but has had significant improvement in her appetite and weight. She is doing her own physical therapy at least 3 times a week and has seen significant improvements in her mobility. She is now doing about 10 laps of about 60 feet in length around her house 3 times each week. She is also lifting weights and feels like she is making significant progress.    Review of Systems     Objective:   Physical Exam  Constitutional:       Her weight is 123 pounds. In retrospect I suspect that the very low weight recorded at her last visit here was in air her as she was quickly back to her normal weight when she was seen by Dr. Laury Axon one week later.  Psychiatric: She has a normal mood and affect.          Assessment & Plan:

## 2011-08-12 NOTE — Assessment & Plan Note (Signed)
I strongly suspect that her lumbar infection is now completely cured. I will continue observation off of all antibiotics. I do not see any need for any further diagnostic testing for infection. She and her daughter know to call me if they have any concerns in the future.

## 2011-08-15 LAB — CBC
HCT: 24.8 — ABNORMAL LOW
HCT: 31.2 — ABNORMAL LOW
Hemoglobin: 11.2 — ABNORMAL LOW
Hemoglobin: 8.3 — ABNORMAL LOW
MCHC: 33.6
MCV: 92.4
Platelets: 290
RBC: 3.61 — ABNORMAL LOW
RDW: 13.7
RDW: 13.8

## 2011-08-15 LAB — CROSSMATCH: ABO/RH(D): O POS

## 2011-08-15 LAB — I-STAT 8, (EC8 V) (CONVERTED LAB)
BUN: 119 — ABNORMAL HIGH
Glucose, Bld: 217 — ABNORMAL HIGH
Potassium: 5.4 — ABNORMAL HIGH
Sodium: 134 — ABNORMAL LOW
pH, Ven: 7.406 — ABNORMAL HIGH

## 2011-08-15 LAB — BASIC METABOLIC PANEL
BUN: 111 — ABNORMAL HIGH
CO2: 25
Calcium: 10
Calcium: 8.3 — ABNORMAL LOW
Creatinine, Ser: 1.29 — ABNORMAL HIGH
GFR calc Af Amer: 50 — ABNORMAL LOW
GFR calc non Af Amer: 27 — ABNORMAL LOW
Glucose, Bld: 237 — ABNORMAL HIGH
Potassium: 5.7 — ABNORMAL HIGH

## 2011-08-15 LAB — DIFFERENTIAL
Basophils Absolute: 0
Basophils Absolute: 0.1
Basophils Relative: 0
Eosinophils Relative: 0
Eosinophils Relative: 0
Lymphocytes Relative: 15
Lymphocytes Relative: 19
Lymphs Abs: 2.7
Monocytes Absolute: 0.6
Monocytes Absolute: 0.8 — ABNORMAL HIGH

## 2011-08-15 LAB — HEMOGLOBIN AND HEMATOCRIT, BLOOD
HCT: 29.8 — ABNORMAL LOW
HCT: 32.8 — ABNORMAL LOW
HCT: 32.8 — ABNORMAL LOW
HCT: 35.1 — ABNORMAL LOW
Hemoglobin: 10 — ABNORMAL LOW
Hemoglobin: 11.1 — ABNORMAL LOW
Hemoglobin: 11.2 — ABNORMAL LOW
Hemoglobin: 11.8 — ABNORMAL LOW

## 2011-08-15 LAB — PROTIME-INR
INR: 1
Prothrombin Time: 13.2

## 2011-08-15 LAB — URINALYSIS, ROUTINE W REFLEX MICROSCOPIC
Bilirubin Urine: NEGATIVE
Ketones, ur: NEGATIVE
Nitrite: NEGATIVE
Protein, ur: NEGATIVE
pH: 6

## 2011-08-15 LAB — OCCULT BLOOD X 1 CARD TO LAB, STOOL: Fecal Occult Bld: POSITIVE

## 2011-08-15 LAB — HEPATIC FUNCTION PANEL
ALT: 21
Bilirubin, Direct: 0.1
Indirect Bilirubin: 0.6
Total Protein: 6.9

## 2011-08-15 LAB — RENAL FUNCTION PANEL
CO2: 21
Glucose, Bld: 94
Potassium: 3.8
Sodium: 148 — ABNORMAL HIGH

## 2011-08-15 LAB — URINE MICROSCOPIC-ADD ON

## 2011-08-15 LAB — APTT: aPTT: 33

## 2011-08-15 LAB — ABO/RH: ABO/RH(D): O POS

## 2011-08-15 LAB — POTASSIUM: Potassium: 4.2

## 2011-08-15 LAB — POCT I-STAT CREATININE: Operator id: 294511

## 2011-09-03 ENCOUNTER — Other Ambulatory Visit: Payer: Self-pay | Admitting: Family Medicine

## 2011-09-03 MED ORDER — LOSARTAN POTASSIUM 50 MG PO TABS: 50.0000 mg | ORAL_TABLET | Freq: Every day | ORAL | Status: DC

## 2011-09-03 NOTE — Telephone Encounter (Signed)
Rx faxed.    KP 

## 2011-09-27 ENCOUNTER — Encounter (HOSPITAL_COMMUNITY): Payer: Self-pay

## 2011-10-02 ENCOUNTER — Ambulatory Visit (HOSPITAL_COMMUNITY)
Admission: RE | Admit: 2011-10-02 | Discharge: 2011-10-02 | Disposition: A | Discharge status: Home / Self Care | Payer: Medicare HMO | Source: Ambulatory Visit | Attending: Anesthesiology | Admitting: Anesthesiology

## 2011-10-02 ENCOUNTER — Encounter (HOSPITAL_COMMUNITY): Payer: Self-pay

## 2011-10-02 ENCOUNTER — Encounter (HOSPITAL_COMMUNITY)
Admission: RE | Admit: 2011-10-02 | Discharge: 2011-10-02 | Disposition: A | Discharge status: Home / Self Care | Payer: Medicare HMO | Source: Ambulatory Visit | Attending: Neurosurgery | Admitting: Neurosurgery

## 2011-10-02 DIAGNOSIS — Z01818 Encounter for other preprocedural examination: Secondary | ICD-10-CM | POA: Insufficient documentation

## 2011-10-02 HISTORY — DX: Unspecified osteoarthritis, unspecified site: M19.90

## 2011-10-02 LAB — CBC
MCHC: 33.9 g/dL (ref 30.0–36.0)
Platelets: 222 10*3/uL (ref 150–400)
RDW: 14.1 % (ref 11.5–15.5)
WBC: 12.7 10*3/uL — ABNORMAL HIGH (ref 4.0–10.5)

## 2011-10-02 LAB — DIFFERENTIAL
Basophils Absolute: 0 10*3/uL (ref 0.0–0.1)
Basophils Relative: 0 % (ref 0–1)
Lymphocytes Relative: 22 % (ref 12–46)
Monocytes Absolute: 0.7 10*3/uL (ref 0.1–1.0)
Neutro Abs: 9 10*3/uL — ABNORMAL HIGH (ref 1.7–7.7)

## 2011-10-02 LAB — COMPREHENSIVE METABOLIC PANEL
ALT: 24 U/L (ref 0–35)
AST: 35 U/L (ref 0–37)
Albumin: 3.8 g/dL (ref 3.5–5.2)
CO2: 27 mEq/L (ref 19–32)
Calcium: 10.4 mg/dL (ref 8.4–10.5)
Chloride: 104 mEq/L (ref 96–112)
Creatinine, Ser: 1.15 mg/dL — ABNORMAL HIGH (ref 0.50–1.10)
GFR calc non Af Amer: 46 mL/min — ABNORMAL LOW (ref >90)
Sodium: 140 mEq/L (ref 135–145)
Total Bilirubin: 0.5 mg/dL (ref 0.3–1.2)

## 2011-10-02 LAB — SURGICAL PCR SCREEN: Staphylococcus aureus: POSITIVE — AB

## 2011-10-02 LAB — TYPE AND SCREEN: Antibody Screen: NEGATIVE

## 2011-10-02 NOTE — Progress Notes (Signed)
Pt is unable to get in and out of bed without assistance; states the bed is too high for her, even at the lowest level.

## 2011-10-02 NOTE — Pre-Procedure Instructions (Signed)
20 Kyndra Condron  10/02/2011   Your procedure is scheduled on:  Monday , Dec 3  Report to [facility] at 0845 AM.  Call this number if you have problems the morning of surgery: [phone]   Remember:   Do not eat food:After Midnight.  May have clear liquids: up to 4 Hours before arrival.  Clear liquids include soda, tea, black coffee, apple or grape juice, broth.  Take these medicines the morning of surgery with A SIP OF WATER: Atenolol (Tenormin),Flexeril,Advair,Oxycodone or Tramadol,Prilosec. Stop aspirin, blood thinners, including fish oil and vitamin E, and herbals   Do not wear jewelry, make-up or nail polish.  Do not wear lotions, powders, or perfumes. You may wear deodorant.  Do not shave 48 hours prior to surgery.  Do not bring valuables to the hospital.  Contacts, dentures or bridgework may not be worn into surgery.  Leave suitcase in the car. After surgery it may be brought to your room.  For patients admitted to the hospital, checkout time is 11:00 AM the day of discharge.   Patients discharged the day of surgery will not be allowed to drive home.  Name and phone number of your driver: n/a  Special Instructions: CHG Shower Use Special Wash: 1/2 bottle night before surgery and 1/2 bottle morning of surgery.   Please read over the following fact sheets that you were given: Pain Booklet, Coughing and Deep Breathing, Blood Transfusion Information, MRSA Information and Surgical Site Infection Prevention

## 2011-10-02 NOTE — Progress Notes (Signed)
EKG done 03/2011 in epic

## 2011-10-04 ENCOUNTER — Telehealth: Payer: Self-pay | Admitting: Family Medicine

## 2011-10-04 MED ORDER — LOSARTAN POTASSIUM 50 MG PO TABS: 50.0000 mg | ORAL_TABLET | Freq: Every day | ORAL | Status: DC

## 2011-10-04 NOTE — Telephone Encounter (Signed)
Rx faxed.    KP 

## 2011-10-04 NOTE — Telephone Encounter (Signed)
Patient wants refill for losartan potassium wants 3 month supply - [place]

## 2011-10-06 MED ORDER — VANCOMYCIN HCL 500 MG IV SOLR
500.0000 mg | Freq: Once | INTRAVENOUS | Status: DC
  Filled 2011-10-06: qty 500

## 2011-10-06 MED ORDER — TOBRAMYCIN SULFATE 80 MG/2ML IJ SOLN
80.0000 mg | Freq: Once | INTRAVENOUS | Status: DC
  Filled 2011-10-06: qty 2

## 2011-10-07 ENCOUNTER — Inpatient Hospital Stay (HOSPITAL_COMMUNITY): Payer: Medicare HMO | Admitting: Anesthesiology

## 2011-10-07 ENCOUNTER — Inpatient Hospital Stay (HOSPITAL_COMMUNITY): Payer: Medicare HMO

## 2011-10-07 ENCOUNTER — Encounter (HOSPITAL_COMMUNITY): Payer: Self-pay | Admitting: Anesthesiology

## 2011-10-07 ENCOUNTER — Encounter (HOSPITAL_COMMUNITY)
Admission: RE | Disposition: A | Discharge status: Home Health | Payer: Self-pay | Source: Ambulatory Visit | Attending: Neurosurgery

## 2011-10-07 ENCOUNTER — Encounter (HOSPITAL_COMMUNITY): Payer: Self-pay | Admitting: Neurosurgery

## 2011-10-07 ENCOUNTER — Inpatient Hospital Stay (HOSPITAL_COMMUNITY)
Admission: RE | Admit: 2011-10-07 | Discharge: 2011-10-10 | DRG: 460 | Disposition: A | Discharge status: Home Health | Payer: Medicare HMO | Source: Ambulatory Visit | Attending: Neurosurgery | Admitting: Neurosurgery

## 2011-10-07 DIAGNOSIS — E785 Hyperlipidemia, unspecified: Secondary | ICD-10-CM

## 2011-10-07 DIAGNOSIS — E119 Type 2 diabetes mellitus without complications: Secondary | ICD-10-CM | POA: Diagnosis present

## 2011-10-07 DIAGNOSIS — I10 Essential (primary) hypertension: Secondary | ICD-10-CM

## 2011-10-07 DIAGNOSIS — M48062 Spinal stenosis, lumbar region with neurogenic claudication: Principal | ICD-10-CM | POA: Diagnosis present

## 2011-10-07 DIAGNOSIS — M48061 Spinal stenosis, lumbar region without neurogenic claudication: Secondary | ICD-10-CM | POA: Diagnosis present

## 2011-10-07 HISTORY — PX: BACK SURGERY: SHX140

## 2011-10-07 LAB — GLUCOSE, CAPILLARY: Glucose-Capillary: 123 mg/dL — ABNORMAL HIGH (ref 70–99)

## 2011-10-07 SURGERY — POSTERIOR LUMBAR FUSION 1 WITH HARDWARE REMOVAL
Anesthesia: General | Site: Back | Wound class: Clean

## 2011-10-07 MED ORDER — INDAPAMIDE 1.25 MG PO TABS
1.2500 mg | ORAL_TABLET | Freq: Every day | ORAL | Status: DC
  Administered 2011-10-08 – 2011-10-10 (×3): 1.25 mg via ORAL
  Filled 2011-10-07 (×4): qty 1

## 2011-10-07 MED ORDER — DIPHENHYDRAMINE HCL 25 MG PO CAPS
50.0000 mg | ORAL_CAPSULE | Freq: Every evening | ORAL | Status: DC | PRN
  Administered 2011-10-08: 50 mg via ORAL
  Filled 2011-10-07: qty 2

## 2011-10-07 MED ORDER — HYDROMORPHONE HCL PF 1 MG/ML IJ SOLN
0.2500 mg | INTRAMUSCULAR | Status: DC | PRN
  Administered 2011-10-07 (×2): 0.25 mg via INTRAVENOUS

## 2011-10-07 MED ORDER — ACETAMINOPHEN 325 MG PO TABS: 650.0000 mg | ORAL_TABLET | ORAL | Status: DC | PRN

## 2011-10-07 MED ORDER — DIAZEPAM 5 MG PO TABS: 5.0000 mg | ORAL_TABLET | Freq: Four times a day (QID) | ORAL | Status: DC | PRN

## 2011-10-07 MED ORDER — VITAMIN E 180 MG (400 UNIT) PO CAPS
400.0000 [IU] | ORAL_CAPSULE | Freq: Every day | ORAL | Status: DC
  Administered 2011-10-07 – 2011-10-10 (×4): 400 [IU] via ORAL
  Filled 2011-10-07 (×4): qty 1

## 2011-10-07 MED ORDER — FLUTICASONE-SALMETEROL 250-50 MCG/DOSE IN AEPB
1.0000 | INHALATION_SPRAY | Freq: Two times a day (BID) | RESPIRATORY_TRACT | Status: DC
  Administered 2011-10-07 – 2011-10-10 (×6): 1 via RESPIRATORY_TRACT
  Filled 2011-10-07: qty 14

## 2011-10-07 MED ORDER — FISH OIL OIL: 1.0000 | TOPICAL_OIL | Freq: Three times a day (TID) | Status: DC

## 2011-10-07 MED ORDER — CYCLOBENZAPRINE HCL 10 MG PO TABS
5.0000 mg | ORAL_TABLET | Freq: Three times a day (TID) | ORAL | Status: DC | PRN
  Administered 2011-10-07: 5 mg via ORAL
  Filled 2011-10-07: qty 1

## 2011-10-07 MED ORDER — ACETAMINOPHEN 650 MG RE SUPP: 650.0000 mg | RECTAL | Status: DC | PRN

## 2011-10-07 MED ORDER — MEPERIDINE HCL 25 MG/ML IJ SOLN: 6.2500 mg | INTRAMUSCULAR | Status: DC | PRN

## 2011-10-07 MED ORDER — THERA M PLUS PO TABS
1.0000 | ORAL_TABLET | Freq: Every day | ORAL | Status: DC
  Administered 2011-10-07 – 2011-10-10 (×4): 1 via ORAL
  Filled 2011-10-07 (×4): qty 1

## 2011-10-07 MED ORDER — HYDROXYZINE HCL 50 MG PO TABS
50.0000 mg | ORAL_TABLET | ORAL | Status: DC | PRN
  Filled 2011-10-07: qty 1

## 2011-10-07 MED ORDER — PHENOL 1.4 % MT LIQD: 1.0000 | OROMUCOSAL | Status: DC | PRN

## 2011-10-07 MED ORDER — ONE-DAILY MULTI VITAMINS PO TABS: 1.0000 | ORAL_TABLET | Freq: Every day | ORAL | Status: DC

## 2011-10-07 MED ORDER — LACTATED RINGERS IV SOLN: INTRAVENOUS | Status: DC

## 2011-10-07 MED ORDER — ROCURONIUM BROMIDE 100 MG/10ML IV SOLN
INTRAVENOUS | Status: DC | PRN
  Administered 2011-10-07: 40 mg via INTRAVENOUS

## 2011-10-07 MED ORDER — BUPIVACAINE HCL (PF) 0.25 % IJ SOLN
INTRAMUSCULAR | Status: DC | PRN
  Administered 2011-10-07: 50 mL

## 2011-10-07 MED ORDER — OXYCODONE-ACETAMINOPHEN 5-325 MG PO TABS: 1.0000 | ORAL_TABLET | ORAL | Status: DC | PRN

## 2011-10-07 MED ORDER — ALBUTEROL SULFATE HFA 108 (90 BASE) MCG/ACT IN AERS
2.0000 | INHALATION_SPRAY | RESPIRATORY_TRACT | Status: DC | PRN
  Filled 2011-10-07: qty 6.7

## 2011-10-07 MED ORDER — PROMETHAZINE HCL 25 MG PO TABS: 25.0000 mg | ORAL_TABLET | Freq: Every day | ORAL | Status: DC | PRN

## 2011-10-07 MED ORDER — OXYCODONE-ACETAMINOPHEN 5-325 MG PO TABS
1.0000 | ORAL_TABLET | Freq: Four times a day (QID) | ORAL | Status: DC | PRN
Start: 2011-10-07 — End: 2011-10-10
  Administered 2011-10-08 (×4): 1 via ORAL
  Administered 2011-10-09 – 2011-10-10 (×3): 2 via ORAL
  Filled 2011-10-07 (×4): qty 2
  Filled 2011-10-07: qty 1
  Filled 2011-10-07: qty 2
  Filled 2011-10-07: qty 1

## 2011-10-07 MED ORDER — TRAMADOL HCL 50 MG PO TABS
50.0000 mg | ORAL_TABLET | Freq: Four times a day (QID) | ORAL | Status: DC | PRN
Start: 2011-10-07 — End: 2011-10-10
  Filled 2011-10-07: qty 1

## 2011-10-07 MED ORDER — SODIUM CHLORIDE 0.9 % IV SOLN: INTRAVENOUS | Status: DC

## 2011-10-07 MED ORDER — PHENYLEPHRINE HCL 10 MG/ML IJ SOLN
10.0000 mg | INTRAVENOUS | Status: DC | PRN
  Administered 2011-10-07: 40 ug/min via INTRAVENOUS

## 2011-10-07 MED ORDER — DEXAMETHASONE SODIUM PHOSPHATE 10 MG/ML IJ SOLN
INTRAMUSCULAR | Status: AC
  Administered 2011-10-07: 10 mg via INTRAVENOUS
  Filled 2011-10-07: qty 1

## 2011-10-07 MED ORDER — SENNA 8.6 MG PO TABS
1.0000 | ORAL_TABLET | Freq: Two times a day (BID) | ORAL | Status: DC
  Administered 2011-10-07 – 2011-10-09 (×3): 8.6 mg via ORAL
  Filled 2011-10-07 (×7): qty 1

## 2011-10-07 MED ORDER — VANCOMYCIN HCL 500 MG IV SOLR
500.0000 mg | INTRAVENOUS | Status: DC
  Administered 2011-10-08 – 2011-10-10 (×3): 500 mg via INTRAVENOUS
  Filled 2011-10-07 (×3): qty 500

## 2011-10-07 MED ORDER — DIPHENHYDRAMINE HCL 50 MG/ML IJ SOLN
INTRAMUSCULAR | Status: AC
  Administered 2011-10-07: 50 mg via INTRAVENOUS
  Filled 2011-10-07: qty 1

## 2011-10-07 MED ORDER — HYDROMORPHONE HCL PF 1 MG/ML IJ SOLN
0.5000 mg | INTRAMUSCULAR | Status: DC | PRN
Start: 2011-10-07 — End: 2011-10-10
  Administered 2011-10-07 – 2011-10-09 (×5): 1 mg via INTRAVENOUS
  Filled 2011-10-07 (×5): qty 1

## 2011-10-07 MED ORDER — MIDAZOLAM HCL 2 MG/2ML IJ SOLN: 0.5000 mg | Freq: Once | INTRAMUSCULAR | Status: DC | PRN

## 2011-10-07 MED ORDER — VANCOMYCIN HCL 1000 MG IV SOLR
500.0000 mg | INTRAVENOUS | Status: DC | PRN
  Administered 2011-10-07: 500 mg via INTRAVENOUS

## 2011-10-07 MED ORDER — SUFENTANIL CITRATE 50 MCG/ML IV SOLN
INTRAVENOUS | Status: DC | PRN
  Administered 2011-10-07: 5 ug via INTRAVENOUS
  Administered 2011-10-07: 10 ug via INTRAVENOUS
  Administered 2011-10-07 (×2): 5 ug via INTRAVENOUS

## 2011-10-07 MED ORDER — HYOSCYAMINE SULFATE 0.125 MG SL SUBL
0.1250 mg | SUBLINGUAL_TABLET | SUBLINGUAL | Status: DC | PRN
  Filled 2011-10-07: qty 1

## 2011-10-07 MED ORDER — VITAMIN D3 25 MCG (1000 UNIT) PO TABS
1000.0000 [IU] | ORAL_TABLET | Freq: Every day | ORAL | Status: DC
  Administered 2011-10-07 – 2011-10-10 (×4): 1000 [IU] via ORAL
  Filled 2011-10-07 (×4): qty 1

## 2011-10-07 MED ORDER — MIDAZOLAM HCL 5 MG/5ML IJ SOLN
INTRAMUSCULAR | Status: DC | PRN
  Administered 2011-10-07: 0.5 mg via INTRAVENOUS

## 2011-10-07 MED ORDER — SODIUM CHLORIDE 0.9 % IJ SOLN: 3.0000 mL | INTRAMUSCULAR | Status: DC | PRN

## 2011-10-07 MED ORDER — PANTOPRAZOLE SODIUM 40 MG PO TBEC
40.0000 mg | DELAYED_RELEASE_TABLET | Freq: Every day | ORAL | Status: DC
  Administered 2011-10-07 – 2011-10-10 (×4): 40 mg via ORAL
  Filled 2011-10-07 (×4): qty 1

## 2011-10-07 MED ORDER — SODIUM CHLORIDE 0.9 % IR SOLN
Status: DC | PRN
  Administered 2011-10-07: 1000 mL

## 2011-10-07 MED ORDER — SODIUM CHLORIDE 0.9 % IR SOLN
Status: DC | PRN
  Administered 2011-10-07: 14:00:00

## 2011-10-07 MED ORDER — FENOFIBRATE 54 MG PO TABS
54.0000 mg | ORAL_TABLET | Freq: Every day | ORAL | Status: DC
  Administered 2011-10-07 – 2011-10-10 (×4): 54 mg via ORAL
  Filled 2011-10-07 (×4): qty 1

## 2011-10-07 MED ORDER — DEXAMETHASONE SODIUM PHOSPHATE 10 MG/ML IJ SOLN
10.0000 mg | Freq: Once | INTRAMUSCULAR | Status: DC
  Filled 2011-10-07: qty 1

## 2011-10-07 MED ORDER — METHOCARBAMOL 500 MG PO TABS: 500.0000 mg | ORAL_TABLET | Freq: Every day | ORAL | Status: DC | PRN

## 2011-10-07 MED ORDER — GLYCOPYRROLATE 0.2 MG/ML IJ SOLN
INTRAMUSCULAR | Status: DC | PRN
  Administered 2011-10-07: .2 mg via INTRAVENOUS
  Administered 2011-10-07: .6 mg via INTRAVENOUS

## 2011-10-07 MED ORDER — CLOTRIMAZOLE 1 % EX CREA
TOPICAL_CREAM | Freq: Two times a day (BID) | CUTANEOUS | Status: DC
  Administered 2011-10-07 – 2011-10-09 (×3): via TOPICAL
  Filled 2011-10-07: qty 15

## 2011-10-07 MED ORDER — PROMETHAZINE HCL 25 MG/ML IJ SOLN: 6.2500 mg | INTRAMUSCULAR | Status: DC | PRN

## 2011-10-07 MED ORDER — ZOLPIDEM TARTRATE 5 MG PO TABS
5.0000 mg | ORAL_TABLET | Freq: Every evening | ORAL | Status: DC | PRN
  Administered 2011-10-07 – 2011-10-10 (×2): 5 mg via ORAL
  Filled 2011-10-07 (×2): qty 1

## 2011-10-07 MED ORDER — DIPHENHYDRAMINE HCL (SLEEP) 25 MG PO TABS: 50.0000 mg | ORAL_TABLET | Freq: Every evening | ORAL | Status: DC | PRN

## 2011-10-07 MED ORDER — ALUM & MAG HYDROXIDE-SIMETH 400-400-40 MG/5ML PO SUSP
30.0000 mL | Freq: Four times a day (QID) | ORAL | Status: DC | PRN
  Filled 2011-10-07: qty 30

## 2011-10-07 MED ORDER — MORPHINE SULFATE 2 MG/ML IJ SOLN: 0.0500 mg/kg | INTRAMUSCULAR | Status: DC | PRN

## 2011-10-07 MED ORDER — ONDANSETRON HCL 4 MG/2ML IJ SOLN
INTRAMUSCULAR | Status: DC | PRN
  Administered 2011-10-07: 4 mg via INTRAVENOUS

## 2011-10-07 MED ORDER — SODIUM CHLORIDE 0.9 % IV SOLN
INTRAVENOUS | Status: DC | PRN
  Administered 2011-10-07: 14:00:00 via INTRAVENOUS

## 2011-10-07 MED ORDER — ATENOLOL 100 MG PO TABS
100.0000 mg | ORAL_TABLET | Freq: Every day | ORAL | Status: DC
  Administered 2011-10-08: 100 mg via ORAL
  Filled 2011-10-07 (×4): qty 1

## 2011-10-07 MED ORDER — PROPOFOL 10 MG/ML IV EMUL
INTRAVENOUS | Status: DC | PRN
  Administered 2011-10-07: 150 mg via INTRAVENOUS

## 2011-10-07 MED ORDER — HYDROCODONE-ACETAMINOPHEN 5-325 MG PO TABS
1.0000 | ORAL_TABLET | ORAL | Status: DC | PRN
  Administered 2011-10-10: 2 via ORAL
  Filled 2011-10-07 (×2): qty 2

## 2011-10-07 MED ORDER — HYDROXYZINE HCL 50 MG/ML IM SOLN
50.0000 mg | INTRAMUSCULAR | Status: DC | PRN
  Filled 2011-10-07: qty 1

## 2011-10-07 MED ORDER — EZETIMIBE-SIMVASTATIN 10-20 MG PO TABS
1.0000 | ORAL_TABLET | Freq: Every day | ORAL | Status: DC
  Administered 2011-10-08 – 2011-10-09 (×2): 1 via ORAL
  Filled 2011-10-07 (×4): qty 1

## 2011-10-07 MED ORDER — VITAMIN C 500 MG PO TABS
1000.0000 mg | ORAL_TABLET | Freq: Two times a day (BID) | ORAL | Status: DC
  Administered 2011-10-07 – 2011-10-10 (×6): 1000 mg via ORAL
  Filled 2011-10-07 (×7): qty 2

## 2011-10-07 MED ORDER — LACTATED RINGERS IV SOLN
INTRAVENOUS | Status: DC | PRN
  Administered 2011-10-07 (×2): via INTRAVENOUS

## 2011-10-07 MED ORDER — DIPHENHYDRAMINE HCL 50 MG/ML IJ SOLN: 50.0000 mg | Freq: Once | INTRAMUSCULAR | Status: DC

## 2011-10-07 MED ORDER — MENTHOL 3 MG MT LOZG: 1.0000 | LOZENGE | OROMUCOSAL | Status: DC | PRN

## 2011-10-07 MED ORDER — NEOSTIGMINE METHYLSULFATE 1 MG/ML IJ SOLN
INTRAMUSCULAR | Status: DC | PRN
  Administered 2011-10-07: 4 mg via INTRAVENOUS

## 2011-10-07 MED ORDER — THROMBIN 20000 UNITS EX KIT
PACK | CUTANEOUS | Status: DC | PRN
  Administered 2011-10-07: 14:00:00 via TOPICAL

## 2011-10-07 MED ORDER — TOBRAMYCIN SULFATE 80 MG/2ML IJ SOLN
80.0000 mg | INTRAVENOUS | Status: DC | PRN
  Administered 2011-10-07: 80 mg via INTRAVENOUS

## 2011-10-07 MED ORDER — LOSARTAN POTASSIUM 50 MG PO TABS
50.0000 mg | ORAL_TABLET | Freq: Every day | ORAL | Status: DC
  Administered 2011-10-07 – 2011-10-09 (×3): 50 mg via ORAL
  Filled 2011-10-07 (×4): qty 1

## 2011-10-07 MED ORDER — OMEGA-3-ACID ETHYL ESTERS 1 G PO CAPS
2.0000 g | ORAL_CAPSULE | Freq: Every day | ORAL | Status: DC
  Administered 2011-10-07 – 2011-10-10 (×4): 2 g via ORAL
  Filled 2011-10-07 (×4): qty 2

## 2011-10-07 SURGICAL SUPPLY — 62 items
BAG DECANTER FOR FLEXI CONT (MISCELLANEOUS) ×2 IMPLANT
BENZOIN TINCTURE PRP APPL 2/3 (GAUZE/BANDAGES/DRESSINGS) ×2 IMPLANT
BLADE SURG ROTATE 9660 (MISCELLANEOUS) IMPLANT
BRUSH SCRUB EZ PLAIN DRY (MISCELLANEOUS) ×2 IMPLANT
BUR MATCHSTICK NEURO 3.0 LAGG (BURR) ×2 IMPLANT
CANISTER SUCTION 2500CC (MISCELLANEOUS) ×2 IMPLANT
CAP INCHANGES (Cap) ×28 IMPLANT
CAP LCK SPNE (Orthopedic Implant) ×2 IMPLANT
CAP LOCK SPINE RADIUS (Orthopedic Implant) ×2 IMPLANT
CAP LOCKING (Orthopedic Implant) ×2 IMPLANT
CLOTH BEACON ORANGE TIMEOUT ST (SAFETY) ×2 IMPLANT
CONT SPEC 4OZ CLIKSEAL STRL BL (MISCELLANEOUS) ×4 IMPLANT
COVER BACK TABLE 24X17X13 BIG (DRAPES) ×2 IMPLANT
COVER TABLE BACK 60X90 (DRAPES) IMPLANT
CROSSLINK MEDIUM (Orthopedic Implant) ×2 IMPLANT
DECANTER SPIKE VIAL GLASS SM (MISCELLANEOUS) IMPLANT
DERMABOND ADVANCED (GAUZE/BANDAGES/DRESSINGS) ×1
DERMABOND ADVANCED .7 DNX12 (GAUZE/BANDAGES/DRESSINGS) ×1 IMPLANT
DRAPE C-ARM 42X72 X-RAY (DRAPES) ×4 IMPLANT
DRAPE LAPAROTOMY 100X72X124 (DRAPES) ×2 IMPLANT
DRAPE POUCH INSTRU U-SHP 10X18 (DRAPES) ×2 IMPLANT
DRAPE PROXIMA HALF (DRAPES) IMPLANT
DRAPE SURG 17X23 STRL (DRAPES) ×8 IMPLANT
ELECT REM PT RETURN 9FT ADLT (ELECTROSURGICAL) ×2
ELECTRODE REM PT RTRN 9FT ADLT (ELECTROSURGICAL) ×1 IMPLANT
EVACUATOR 1/8 PVC DRAIN (DRAIN) ×2 IMPLANT
GAUZE SPONGE 4X4 16PLY XRAY LF (GAUZE/BANDAGES/DRESSINGS) IMPLANT
GLOVE BIOGEL PI IND STRL 7.0 (GLOVE) ×2 IMPLANT
GLOVE BIOGEL PI IND STRL 8.5 (GLOVE) ×1 IMPLANT
GLOVE BIOGEL PI INDICATOR 7.0 (GLOVE) ×2
GLOVE BIOGEL PI INDICATOR 8.5 (GLOVE) ×1
GLOVE ECLIPSE 8.5 STRL (GLOVE) ×6 IMPLANT
GLOVE EXAM NITRILE LRG STRL (GLOVE) IMPLANT
GLOVE EXAM NITRILE MD LF STRL (GLOVE) ×4 IMPLANT
GLOVE EXAM NITRILE XL STR (GLOVE) IMPLANT
GLOVE EXAM NITRILE XS STR PU (GLOVE) IMPLANT
GLOVE OPTIFIT SS 6.5 STRL BRWN (GLOVE) ×8 IMPLANT
GLOVE SURG SS PI 8.0 STRL IVOR (GLOVE) ×4 IMPLANT
GOWN BRE IMP SLV AUR LG STRL (GOWN DISPOSABLE) ×2 IMPLANT
GOWN BRE IMP SLV AUR XL STRL (GOWN DISPOSABLE) ×4 IMPLANT
GOWN STRL REIN 2XL LVL4 (GOWN DISPOSABLE) ×2 IMPLANT
KIT BASIN OR (CUSTOM PROCEDURE TRAY) ×2 IMPLANT
KIT INFUSE SMALL (Orthopedic Implant) ×2 IMPLANT
KIT ROOM TURNOVER OR (KITS) ×2 IMPLANT
NEEDLE HYPO 22GX1.5 SAFETY (NEEDLE) ×2 IMPLANT
NS IRRIG 1000ML POUR BTL (IV SOLUTION) ×2 IMPLANT
PACK LAMINECTOMY NEURO (CUSTOM PROCEDURE TRAY) ×2 IMPLANT
PUTTY 5ML ACTIFUSE ABX (Putty) ×2 IMPLANT
ROD HEX NO 60MM (Rod) ×4 IMPLANT
SCREW 5.75X40M (Screw) ×4 IMPLANT
SPONGE GAUZE 4X4 12PLY (GAUZE/BANDAGES/DRESSINGS) ×2 IMPLANT
SPONGE SURGIFOAM ABS GEL 100 (HEMOSTASIS) ×2 IMPLANT
STRIP CLOSURE SKIN 1/2X4 (GAUZE/BANDAGES/DRESSINGS) ×4 IMPLANT
SUT VIC AB 0 CT1 18XCR BRD8 (SUTURE) ×2 IMPLANT
SUT VIC AB 0 CT1 8-18 (SUTURE) ×2
SUT VIC AB 2-0 CT1 18 (SUTURE) ×2 IMPLANT
SUT VIC AB 3-0 SH 8-18 (SUTURE) ×4 IMPLANT
SYR 20ML ECCENTRIC (SYRINGE) ×2 IMPLANT
TOWEL OR 17X24 6PK STRL BLUE (TOWEL DISPOSABLE) ×2 IMPLANT
TOWEL OR 17X26 10 PK STRL BLUE (TOWEL DISPOSABLE) ×2 IMPLANT
TRAY FOLEY CATH 14FRSI W/METER (CATHETERS) ×2 IMPLANT
WATER STERILE IRR 1000ML POUR (IV SOLUTION) ×2 IMPLANT

## 2011-10-07 NOTE — Progress Notes (Signed)
ANTICOAGULATION CONSULT NOTE - Initial Consult  Pharmacy Consult for Vancomycin Indication: emiric post op antibiotic s/p L3-4 decompressive laminectomy   Allergies  Allergen Reactions  . Ace Inhibitors   . Codeine Hives  . Penicillins Hives    Patient Measurements: Height: 4\' 10"  (147.3 cm) Weight: 130 lb (58.968 kg) IBW/kg (Calculated) : 40.9    Vital Signs: Temp: 98.1 F (36.7 C) (12/03 1600) Temp src: Oral (12/03 1600) BP: 160/77 mmHg (12/03 1600) Pulse Rate: 50  (12/03 1600)  Labs: No results found for this basename: HGB:2,HCT:3,PLT:3,APTT:3,LABPROT:3,INR:3,HEPARINUNFRC:3,CREATININE:3,CKTOTAL:3,CKMB:3,TROPONINI:3 in the last 72 hours Estimated Creatinine Clearance: 33.1 ml/min (by C-G formula based on Cr of 1.15).  Medical History: Past Medical History  Diagnosis Date  . Hyperlipidemia   . Osteoporosis   . Hyperplastic polyps of stomach   . Hypertension     on meds  . Diabetes mellitus     Type 2 niddm x 2004- diet controlled  . Asthma     uses advair prn  . Renal insufficiency     h/o proteinuria; resolved  . Arthritis     Medications:  Prescriptions prior to admission  Medication Sig Dispense Refill  . albuterol (PROAIR HFA) 108 (90 BASE) MCG/ACT inhaler Inhale 2 puffs into the lungs every 4 (four) hours as needed. For shortness of breath.      . Ascorbic Acid (VITAMIN C) 500 MG tablet Take 1,000 mg by mouth 2 (two) times daily.       Marland Kitchen atenolol (TENORMIN) 100 MG tablet Take 1 tablet (100 mg total) by mouth daily.  90 tablet  3  . Calcium Citrate-Vitamin D (CALCIUM CITRATE + PO) Take 2 tablets by mouth 2 (two) times daily.        . cholecalciferol (VITAMIN D) 1000 UNITS tablet Take 1,000 Units by mouth daily.        . clotrimazole-betamethasone (LOTRISONE) cream Apply 1 application topically 2 (two) times daily as needed. For rash.       . cyclobenzaprine (FLEXERIL) 5 MG tablet Take 5 mg by mouth 3 (three) times daily as needed. For muscle spasm.         . diphenhydrAMINE (SOMINEX) 25 MG tablet Take 50 mg by mouth at bedtime as needed. For allergies.       . fenofibrate 54 MG tablet Take 1 tablet (54 mg total) by mouth daily.  90 tablet  3  . Fluticasone-Salmeterol (ADVAIR DISKUS) 250-50 MCG/DOSE AEPB Inhale 1 puff into the lungs 2 (two) times daily.       . indapamide (LOZOL) 1.25 MG tablet Take 1 tablet (1.25 mg total) by mouth daily.  90 tablet  3  . losartan (COZAAR) 50 MG tablet Take 1 tablet (50 mg total) by mouth daily.  90 tablet  1  . methocarbamol (ROBAXIN) 500 MG tablet Take 500 mg by mouth daily as needed. For back spasm.       . Multiple Vitamin (MULTIVITAMIN) tablet Take 1 tablet by mouth daily.        Marland Kitchen omeprazole (PRILOSEC) 20 MG capsule Take 20 mg by mouth daily.        Marland Kitchen oxyCODONE-acetaminophen (PERCOCET) 5-325 MG per tablet Take 1-2 tablets by mouth every 6 (six) hours as needed. for pain       . promethazine (PHENERGAN) 25 MG tablet Take 25 mg by mouth daily as needed. For nausea.       Marland Kitchen ezetimibe-simvastatin (VYTORIN) 10-20 MG per tablet Take 1 tablet every 3 days       .  Fish Oil OIL Take 1 tablet by mouth 3 (three) times daily.       . Flaxseed, Linseed, (FLAX SEED OIL PO) Take 1 tablet by mouth daily.       Marland Kitchen glucose blood test strip Check blood sugars qid  100 each  12  . hyoscyamine (LEVSIN SL) 0.125 MG SL tablet Place 0.125 mg under the tongue every 4 (four) hours as needed. For diarrhea.      . traMADol (ULTRAM) 50 MG tablet Take 50 mg by mouth every 6 (six) hours as needed. For pain.      . vitamin E 400 UNIT capsule Take 400 Units by mouth daily.          Assessment: 76yo female s/p L3-4 decompressive laminectomy, redo bilateral L3 and L4 decompressive laminotomies. Patient received Vancomycin 500mg  IV x1 @ 12:25 today and Tobramycin 80mg  IV x1.  Preop labs 10/02/11 SCr = 1.15; Estimated CrCl =53ml/min.  WBC =12.7K (10/02/11).  Currently afebrile.   Goal of Therapy:  Vancomycin trough 10-11mcg/ml   Plan:  Vancomycin 500mg  IV q24h next dose due tomorrow at 12Noon.   Arman Filter 10/07/2011,5:42 PM

## 2011-10-07 NOTE — Preoperative (Signed)
Beta Blockers   Reason not to administer Beta Blockers:Atenolol 8 a.m. today.

## 2011-10-07 NOTE — Brief Op Note (Signed)
10/07/2011  2:33 PM  PATIENT:  [name]  76 y.o. female  PRE-OPERATIVE DIAGNOSIS:  Lumbar three-four instability with stenosis   POST-OPERATIVE DIAGNOSIS:  Lumbar three-four instability with stenosis   PROCEDURE:  Procedure(s): POSTERIOR LUMBAR FUSION 1 WITH HARDWARE REMOVAL  SURGEON:  Surgeon(s): Sherilyn Cooter A Kelly Eisler Mariam Dollar  PHYSICIAN ASSISTANT:   ASSISTANTS: none   ANESTHESIA:   general  EBL:  Total I/O In: 1600 [I.V.:1600] Out: 300 [Urine:50; Blood:250]  BLOOD ADMINISTERED:none  DRAINS: ( ) Hemovact drain(s) in the   with  Suction Open   LOCAL MEDICATIONS USED:  MARCAINE 30 CC  SPECIMEN:  No Specimen  DISPOSITION OF SPECIMEN:  N/A  COUNTS:  YES  TOURNIQUET:  * No tourniquets in log *  DICTATION: .Dragon Dictation  PLAN OF CARE: Admit to inpatient   PATIENT DISPOSITION:  PACU - hemodynamically stable.   Delay start of Pharmacological VTE agent (>24hrs) due to surgical blood loss or risk of bleeding: Yes

## 2011-10-07 NOTE — Transfer of Care (Signed)
Immediate Anesthesia Transfer of Care Note  Patient: [name]  Procedure(s) Performed:  POSTERIOR LUMBAR FUSION 1 WITH HARDWARE REMOVAL - Lumbar three-four fusion with Lumbar three decompression and Lumbar three to Lumbar five posterior lateral arthrodesis; Reexploration of Lumbar four-five fusion with removal of hardware  Patient Location: PACU  Anesthesia Type: General  Level of Consciousness: awake, sedated, patient cooperative and responds to stimulation  Airway & Oxygen Therapy: Patient Spontanous Breathing and Patient connected to nasal cannula oxygen  Post-op Assessment: Report given to PACU RN, Post -op Vital signs reviewed and stable and Patient moving all extremities  Post vital signs: Reviewed and stable  Complications: No apparent anesthesia complications

## 2011-10-07 NOTE — H&P (Signed)
[name] is an 76 y.o. female.   Chief Complaint: Back pain HPI: 62 or old female status post an L4-5 decompression and fusion in 2004 with good results. Patient has uncomplicated history since then with 2 episodes of spontaneous discitis at the L3-4 level. A shoe the pneumatic medically managed for this and the discitis has been eradicated. Patient has chronic segmental instability and stenosis at the L3-4 level. Patient presents now for L3-4 decompression and fusion. Main complaint is that of severe back pain with activity. She does have some radiating pain into her lower extremities and some symptoms consistent with claudication. She has no bowel or bladder dysfunction.  Past Medical History  Diagnosis Date  . Hyperlipidemia   . Osteoporosis   . Hyperplastic polyps of stomach   . Hypertension     on meds  . Diabetes mellitus     Type 2 niddm x 2004- diet controlled  . Asthma     uses advair prn  . Renal insufficiency     h/o proteinuria; resolved  . Arthritis     Past Surgical History  Procedure Date  . Cesarean section     x2  . Appendectomy 1964  . Back surgery 2004  . Back surgery 1967    Family History  Problem Relation Age of Onset  . Hypertension    . Hyperlipidemia Mother   . Heart disease Father    Social History:  reports that she quit smoking about 13 years ago. Her smoking use included Cigarettes. She quit after 40 years of use. She has never used smokeless tobacco. She reports that she does not drink alcohol or use illicit drugs.  Allergies:  Allergies  Allergen Reactions  . Ace Inhibitors   . Codeine Hives  . Penicillins Hives    Medications Prior to Admission  Medication Dose Route Frequency Provider Last Rate Last Dose  . tobramycin (NEBCIN) 80 mg in dextrose 5 % 50 mL IVPB  80 mg Intravenous Once [employer]      . vancomycin (VANCOCIN) 500 mg in sodium chloride 0.9 % 100 mL IVPB  500 mg Intravenous Once [employer]       Medications Prior to  Admission  Medication Sig Dispense Refill  . albuterol (PROAIR HFA) 108 (90 BASE) MCG/ACT inhaler Inhale 2 puffs into the lungs every 4 (four) hours as needed. For shortness of breath.      . Ascorbic Acid (VITAMIN C) 500 MG tablet Take 1,000 mg by mouth 2 (two) times daily.       Marland Kitchen atenolol (TENORMIN) 100 MG tablet Take 1 tablet (100 mg total) by mouth daily.  90 tablet  3  . cholecalciferol (VITAMIN D) 1000 UNITS tablet Take 1,000 Units by mouth daily.        . clotrimazole-betamethasone (LOTRISONE) cream Apply 1 application topically 2 (two) times daily as needed. For rash.       . ezetimibe-simvastatin (VYTORIN) 10-20 MG per tablet Take 1 tablet every 3 days       . fenofibrate 54 MG tablet Take 1 tablet (54 mg total) by mouth daily.  90 tablet  3  . Fish Oil OIL Take 1 tablet by mouth 3 (three) times daily.       . Flaxseed, Linseed, (FLAX SEED OIL PO) Take 1 tablet by mouth daily.       . Fluticasone-Salmeterol (ADVAIR DISKUS) 250-50 MCG/DOSE AEPB Inhale 1 puff into the lungs 2 (two) times daily.       Marland Kitchen  glucose blood test strip Check blood sugars qid  100 each  12  . hyoscyamine (LEVSIN SL) 0.125 MG SL tablet Place 0.125 mg under the tongue every 4 (four) hours as needed. For diarrhea.      . indapamide (LOZOL) 1.25 MG tablet Take 1 tablet (1.25 mg total) by mouth daily.  90 tablet  3  . methocarbamol (ROBAXIN) 500 MG tablet Take 500 mg by mouth daily as needed. For back spasm.       . Multiple Vitamin (MULTIVITAMIN) tablet Take 1 tablet by mouth daily.        Marland Kitchen oxyCODONE-acetaminophen (PERCOCET) 5-325 MG per tablet Take 1-2 tablets by mouth every 6 (six) hours as needed. for pain       . traMADol (ULTRAM) 50 MG tablet Take 50 mg by mouth every 6 (six) hours as needed. For pain.      . vitamin E 400 UNIT capsule Take 400 Units by mouth daily.          No results found for this or any previous visit (from the past 48 hour(s)). No results found.  Review of Systems  All other systems  reviewed and are negative.    There were no vitals taken for this visit. Physical Exam  Constitutional: She is oriented to person, place, and time. She appears well-developed and well-nourished. No distress.  HENT:  Head: Normocephalic and atraumatic.  Right Ear: External ear normal.  Left Ear: External ear normal.  Eyes: Conjunctivae and EOM are normal. Pupils are equal, round, and reactive to light.  Neck: Normal range of motion. Neck supple. No tracheal deviation present. No thyromegaly present.  Cardiovascular: Normal rate, regular rhythm, normal heart sounds and intact distal pulses.   No murmur heard. Respiratory: Effort normal and breath sounds normal. No respiratory distress. She has no wheezes.  GI: Soft. Bowel sounds are normal. She exhibits no distension. There is no tenderness.  Neurological: She is alert and oriented to person, place, and time. She displays abnormal reflex. No cranial nerve deficit. Coordination normal.  Skin: Skin is warm and dry. She is not diaphoretic.  Psychiatric: She has a normal mood and affect. Her behavior is normal. Judgment and thought content normal.   her patellar and Achilles are plus are depressed bilaterally. She has grossly intact motor strength but some generalized muscle wasting and poor overall muscle tone.  Assessment/Plan L3-4 instability with stenosis status post previous spontaneous bacterial discitis. Patient is status post L4-5 fusion which appears solid. Plan is for reexploration of her L4-5 fusion with L3 for decompression and L3-L5 posterior lateral arthrodesis. Risks and benefits have been explained patient wishes to proceed.  Kanesha Cadle A 10/07/2011, 8:00 AM

## 2011-10-07 NOTE — Op Note (Signed)
Date of procedure: 10/07/2011  Date of dictation: 10/07/2011  Service: Neurosurgery  Preoperative diagnosis: L3-4 instability with stenosis. Status post L4-5 decompression and fusion with instrumentation.  Postoperative diagnosis: Same  Procedure Name: L3-4 decompressive laminectomy, redo bilateral L3 and L4 decompressive laminotomies.  Reexploration of L4-5 fusion with removal of hardware.  L3 L4-L5 posterior lateral arthrodesis utilizing segmental pedicle screw is mentation local autographing bone graft extender and bone morphogenic protein.  Surgeon:Afomia Blackley A.Mishti Swanton, M.D.  Asst. Surgeon: Wynetta Emery  Anesthesia: General  Indication: [name] is a 76 year old female status post L4-5 decompression and fusion remotely in the past who had the complication of a spontaneous bacterial discitis at L3-4 approximately 2 years ago. Patient's been treated medically and the infection is eradicated. Patient has developed progressive instability at the L3-4 level with severe stenosis. The patient has finally been medically cleared undergo decompression and fusion surgery. Patient presents now for L3-4 decompression and fusion with reexploration of her L4-5 fusion.  Operative note: The patient is taken to the operating room placed on table in supine position. After adequate level of anesthesia achieved, the patient is positioned prone onto the Wilson frame and appropriate padded. Patient's lumbar region was prepped and draped sterilely. 10 blade used to make a linear skin incision overlying the L3-L4 and L5 levels. Subperiosteal dissection then performed so the lamina and facet joints of L3 and 4 as well as a previous posterior lateral fusion instrumentation at L4-5. Deep self-retaining traction placed intraoperative fluoroscopy is used levels were confirmed. Pedicle screw station L4-5 was disassembled. Rod was removed. Fusion L4-5 was inspected and found to be solid. Pedicle screws at L4 and L5 are left in place.  Decompressive laminectomies then performed using the high-speed drill Kerrison rongeurs and Leksell rongeurs to remove the entire lamina of L3 medial aspect the facet joint of L3 and L4. The gutters of the facet joints were undercut using Kerrison rongeurs. Wide decompressive foraminotomies were performed along the course the exiting L3 and L4 nerve roots bilaterally. After a very thorough depression achieved patient was then placed upon fusion. Pedicles of L3 was identified using surface landmarks and intraoperative fluoroscopy. Superficial bone of overlying the pedicle was then removed using a high-speed drill. Pedicle was then probed using a pedicle awl. Each pedicle awl track was then tapped with a 5.25 mm screw temperature Olson probed and found to be solidly within bone. 5.75 x 40 mm radius screws were placed bilaterally at L3. Transverse processes of L3 L4-L5 were then decorticated using high-speed drill. Morselized autograft mixed with active use and combine it with bone morphogenic protein-soaked sponges were placed posterolaterally for later fusion. Short segment titanium rod was then contoured in place of the screw heads at L3 L4-L5. Locking screws and placed over the screw heads. Locking catching and engaged with the construct under compression. Transverse connector was placed. Final images real good position the bone graft or proper level with normal lamina spine. Wounds that area one final time. Gelfoam was placed off for hemostasis. The pneumatic drain was left at per space. Wounds and close in layers with Vicryl sutures. Steri-Strips and sterile dressing were applied. There no pertinent patient. The patient tolerated the procedure well and returned to her room postop.

## 2011-10-07 NOTE — Anesthesia Preprocedure Evaluation (Addendum)
Anesthesia Evaluation  Patient identified by MRN, date of birth, ID band Patient awake    Reviewed: Allergy & Precautions, H&P , NPO status , Patient's Chart, lab work & pertinent test results, reviewed documented beta blocker date and time   Airway Mallampati: II TM Distance: >3 FB Neck ROM: Full    Dental  (+) Edentulous Upper, Partial Lower and Dental Advisory Given   Pulmonary asthma , Recent URI , Residual Cough,  clear to auscultation        Cardiovascular hypertension, Pt. on medications Regular     Neuro/Psych    GI/Hepatic GERD-  Medicated and Controlled,  Endo/Other  Diabetes mellitus-, Well Controlled, Type 2  Renal/GU      Musculoskeletal   Abdominal   Peds  Hematology   Anesthesia Other Findings   Reproductive/Obstetrics                         Anesthesia Physical Anesthesia Plan  ASA: III  Anesthesia Plan: General   Post-op Pain Management:    Induction:   Airway Management Planned: Oral ETT  Additional Equipment:   Intra-op Plan:   Post-operative Plan: Extubation in OR  Informed Consent: I have reviewed the patients History and Physical, chart, labs and discussed the procedure including the risks, benefits and alternatives for the proposed anesthesia with the patient or authorized representative who has indicated his/her understanding and acceptance.   Dental advisory given  Plan Discussed with: CRNA  Anesthesia Plan Comments:         Anesthesia Quick Evaluation

## 2011-10-07 NOTE — Progress Notes (Signed)
Patient [name], 76 year old Asian female suffers with spinal stenosis of lumbar region with neurogenic claudication.  She feels some anxiety about her health; but is encouraged by support of her family.  Patient expressed appreciation for chaplain's provision of pastoral prayer, presence, and conversation.  Will follow-up as needed.

## 2011-10-07 NOTE — Anesthesia Postprocedure Evaluation (Signed)
  Anesthesia Post-op Note  Patient: [name]  Procedure(s) Performed:  POSTERIOR LUMBAR FUSION 1 WITH HARDWARE REMOVAL - Lumbar three-four fusion with Lumbar three decompression and Lumbar three to Lumbar five posterior lateral arthrodesis; Reexploration of Lumbar four-five fusion with removal of hardware  Patient Location: PACU  Anesthesia Type: General  Level of Consciousness: alert   Airway and Oxygen Therapy: Patient Spontanous Breathing  Post-op Pain: mild  Post-op Assessment: Post-op Vital signs reviewed  Post-op Vital Signs: stable  Complications: No apparent anesthesia complications

## 2011-10-08 ENCOUNTER — Encounter (HOSPITAL_COMMUNITY): Payer: Self-pay | Admitting: *Deleted

## 2011-10-08 LAB — GLUCOSE, CAPILLARY: Glucose-Capillary: 141 mg/dL — ABNORMAL HIGH (ref 70–99)

## 2011-10-08 NOTE — Plan of Care (Signed)
Problem: Consults Goal: Diagnosis - Spinal Surgery Outcome: Completed/Met Date Met:  10/08/11 Microdiscectomy

## 2011-10-08 NOTE — Progress Notes (Signed)
Physical Therapy Evaluation Patient Details Name: [name] MRN: [id number] DOB: 1935/03/29 Today's Date: 10/08/2011  Problem List:  Patient Active Problem List  Diagnoses  . DM w/o Complication Type II  . HYPERLIPIDEMIA  . UNSPECIFIED CATARACT  . HYPERTENSION  . ALLERGIC RHINITIS, SEASONAL  . ASTHMA  . IBS  . GI BLEEDING  . OSTEOPOROSIS  . DIARRHEA  . ABNORMAL ELECTROCARDIOGRAM  . HX, PERSONAL, COLONIC POLYPS  . HX, PERSONAL, ARTHRITIS  . Pneumonia  . Back pain  . Rash  . Diskitis  . Liver abscess  . Neuropathy  . Weight loss, unintentional  . Spinal stenosis of lumbar region with neurogenic claudication    Past Medical History:  Past Medical History  Diagnosis Date  . Hyperlipidemia   . Osteoporosis   . Hyperplastic polyps of stomach   . Hypertension     on meds  . Diabetes mellitus     Type 2 niddm x 2004- diet controlled  . Asthma     uses advair prn  . Renal insufficiency     h/o proteinuria; resolved  . Arthritis    Past Surgical History:  Past Surgical History  Procedure Date  . Cesarean section     x2  . Appendectomy 1964  . Back surgery 2004  . Back surgery 1967    PT Assessment/Plan/Recommendation PT Assessment Clinical Impression Statement: Pt presents to PT after lumbar fusion with expected decr. in mobility.  Expect pt will make good progress and be able to return home with family who can provide intermiittent support. PT Recommendation/Assessment: Patient will need skilled PT in the acute care venue PT Problem List: Decreased mobility;Pain Barriers to Discharge: Decreased caregiver support (family works and pt is home alone during the day) PT Therapy Diagnosis : Difficulty walking PT Plan PT Frequency: Min 6X/week PT Treatment/Interventions: DME instruction;Gait training;Stair training;Functional mobility training;Patient/family education PT Recommendation Follow Up Recommendations: Home health PT Equipment Recommended: None  recommended by PT PT Goals  Acute Rehab PT Goals PT Goal Formulation: With patient Time For Goal Achievement: 7 days Pt will Roll Supine to Right Side: with modified independence PT Goal: Rolling Supine to Right Side - Progress: Not met Pt will go Supine/Side to Sit: with modified independence PT Goal: Supine/Side to Sit - Progress: Not met Pt will go Sit to Supine/Side: with modified independence PT Goal: Sit to Supine/Side - Progress: Not met Pt will go Sit to Stand: with modified independence PT Goal: Sit to Stand - Progress: Not met Pt will go Stand to Sit: with modified independence PT Goal: Stand to Sit - Progress: Not met Pt will Ambulate: 51 - 150 feet PT Goal: Ambulate - Progress: Not met Pt will Go Up / Down Stairs: 3-5 stairs;with least restrictive assistive device;with min assist PT Goal: Up/Down Stairs - Progress: Not met  PT Evaluation Precautions/Restrictions  Precautions Precautions: Back Required Braces or Orthoses: Yes Spinal Brace: Thoracolumbosacral orthotic;Applied in sitting position Prior Functioning  Home Living Lives With: Daughter (son in law, grandchildren.  Pt will be alone during the day.) Type of Home: House Home Layout: One level Home Access: Stairs to enter Entrance Stairs-Rails: Right;Left Entrance Stairs-Number of Steps: 7 Bathroom Shower/Tub: Tub/shower unit;Curtain Firefighter: Standard Home Adaptive Equipment: Straight cane;Walker - rolling;Wheelchair - Medical illustrator Prior Function Level of Independence: Requires assistive device for independence;Independent with gait;Independent with homemaking with ambulation (does dishes and some cooking.  Uses RW except cane for staia) Driving: No Vocation: Retired Financial risk analyst Arousal/Alertness: Awake/alert Overall Cognitive Status:  Appears within functional limits for tasks assessed Orientation Level: Oriented X4 Sensation/Coordination Sensation Light Touch: Impaired by gross  assessment (bilateral feet numb and tingling.) Extremity Assessment RLE Assessment RLE Assessment: Within Functional Limits LLE Assessment LLE Assessment: Within Functional Limits Mobility (including Balance) Bed Mobility Bed Mobility: Yes Rolling Right: 4: Min assist;With rail Rolling Right Details (indicate cue type and reason): Verbal cues for technique. Right Sidelying to Sit: 4: Min assist;With rails;HOB flat Right Sidelying to Sit Details (indicate cue type and reason): Verbal cues for technique.  Assist to raise trunk. Sitting - Scoot to Edge of Bed: 4: Min assist Transfers Transfers: Yes Sit to Stand: 4: Min assist;With upper extremity assist;From bed Sit to Stand Details (indicate cue type and reason): Verbal cues for hand placement. Stand to Sit: 4: Min assist;With upper extremity assist;With armrests;To chair/3-in-1 (to control descent) Stand to Sit Details: verbal cues for hand placement Ambulation/Gait Ambulation/Gait: Yes Ambulation/Gait Assistance: 4: Min assist Ambulation/Gait Assistance Details (indicate cue type and reason): Cues to stand more erect Ambulation Distance (Feet): 130 Feet Assistive device: Rolling walker Gait Pattern: Decreased step length - right;Decreased step length - left Gait velocity: slow cadence Stairs: Yes Stairs Assistance: 4: Min assist Stairs Assistance Details (indicate cue type and reason): practiced stepping back on 1 step to be used to assist pt. back to bed.  Pt is very short and without step bed is too high for her to get into easily. Stair Management Technique: Backwards;With walker Number of Stairs: 1  Height of Stairs: 6   Balance Balance Assessed: Yes Static Standing Balance Static Standing - Balance Support: Bilateral upper extremity supported (using rolling walker) Exercise    End of Session PT - End of Session Equipment Utilized During Treatment: Gait belt;Back brace Activity Tolerance: Patient limited by pain Patient  left: in chair;with call bell in reach Nurse Communication: Mobility status for ambulation;Mobility status for transfers General Behavior During Session: [facility] for tasks performed Cognition: [facility] for tasks performed  [facility] 10/08/2011, 1:48 PM [employer] PT [phone]

## 2011-10-08 NOTE — Progress Notes (Signed)
Postoperative day 1. Overall doing well. Back pain improved. No lower trimming a pain. Still has bilateral distal lower trimming numbness which is stable from preop. Motor function is intact. Wound healing well. Chest abdomen exams are benign.  Doing well postop. Mobilize as tolerated with physical and occupational therapy. Continue Hemovac drain.

## 2011-10-09 ENCOUNTER — Other Ambulatory Visit: Payer: Medicare HMO

## 2011-10-09 LAB — GLUCOSE, CAPILLARY: Glucose-Capillary: 158 mg/dL — ABNORMAL HIGH (ref 70–99)

## 2011-10-09 NOTE — Progress Notes (Signed)
ANTIBIOTIC CONSULT NOTE - FOLLOW UP  Pharmacy Consult for   Vancomycin Indication:  Post-op prophylaxis, s/p lumbar surgery 12/3  Allergies  Allergen Reactions  . Ace Inhibitors   . Codeine Hives  . Penicillins Hives    Patient Measurements: Height: 4\' 10"  (147.3 cm) Weight: 130 lb (58.968 kg) IBW/kg (Calculated) : 40.9   Vital Signs: Temp: 99 F (37.2 C) (12/05 0630) Temp src: Oral (12/05 0630) BP: 116/64 mmHg (12/05 0705) Pulse Rate: 60  (12/05 0630) Intake/Output from previous day: 12/04 0701 - 12/05 0700 In: 960 [P.O.:960] Out: 1260 [Urine:750; Drains:510]  Assessment:     Continues on Vancomycin 500 mg IV q24h.     Vanc to continue while drain is in.     No post-op labs yet.  Goal of Therapy:  Vancomycin trough level 10-15 mcg/ml  Plan:      Continue Vancomycin 500 mg IV q24hrs.     Will check AM bmet and CBC.     Will follow-up for drain removal and d/c vanc.   Dennie Fetters Pager:  [phone] 10/09/2011,9:38 AM

## 2011-10-09 NOTE — Progress Notes (Signed)
Patient ID: [name], female   DOB: [birth year], 76 y.o.   MRN: [id number] Postoperative day 2. Overall patient doing well. Still complains of incisional back pain but preoperative back pain is improved. Has some achiness in her right anterior thigh but no true radicular pain. Overall seems significantly improved from preoperative state.  On examination she is awake and alert. Motor examination is intact bilaterally. Sensory examination is stable with bilateral stocking sensory loss in both distal lower extremities. Dressing is dry. Drain output is diminishing. Chest and abdominal examinations are benign.  Overall patient doing well following lumbar fusion surgery. Plan is for further mobilization today possible discharge tomorrow with home health physical and occupational therapy.

## 2011-10-09 NOTE — Progress Notes (Signed)
Physical Therapy Treatment Patient Details Name: [name] MRN: [id number] DOB: [birth year] Today's Date: 10/09/2011  PT Assessment/Plan  PT - Assessment/Plan Comments on Treatment Session: Pt progressing well with PT goals.  Moves fairly well despite pain.  Pt very pleasant & willing to participate in PT session.   PT Plan: Discharge plan remains appropriate PT Frequency: Min 6X/week Follow Up Recommendations: Home health PT Equipment Recommended: None recommended by PT PT Goals  Acute Rehab PT Goals PT Goal: Rolling Supine to Right Side - Progress: Met PT Goal: Supine/Side to Sit - Progress: Progressing toward goal PT Goal: Sit to Stand - Progress: Progressing toward goal PT Goal: Stand to Sit - Progress: Progressing toward goal PT Goal: Ambulate - Progress: Progressing toward goal PT Goal: Up/Down Stairs - Progress: Progressing toward goal  PT Treatment Precautions/Restrictions  Precautions Precautions: Back Precaution Comments: Pt independently verbalized 3/3 back precautions but requires mod cueing throughout session to reinforce precautions.   Required Braces or Orthoses: Yes Spinal Brace: Lumbar corset;Applied in sitting position (required max (A) to donn back brace. ) Restrictions Weight Bearing Restrictions: No Mobility (including Balance) Bed Mobility Rolling Right: 6: Modified independent (Device/Increase time);With rail Rolling Right Details (indicate cue type and reason): no cues needed only increased time.   Right Sidelying to Sit: 5: Supervision;With rails Right Sidelying to Sit Details (indicate cue type and reason): cues for no twisting when pushing shoulders/trunk to sitting & getting RUE positioned properly.   Sitting - Scoot to Edge of Bed: 6: Modified independent (Device/Increase time) Transfers Sit to Stand: 5: Supervision;From toilet;From bed;With armrests;With upper extremity assist;Other (comment) (mat table ) Sit to Stand Details (indicate cue type and  reason): cues to remind pt of hand placement Stand to Sit: 5: Supervision;To chair/3-in-1;To toilet;Other (comment);With upper extremity assist (mat table) Stand to Sit Details: cues for hand placement Ambulation/Gait Ambulation/Gait Assistance: 5: Supervision Ambulation/Gait Assistance Details (indicate cue type and reason): cues to increase/maintain upright posture, increase step/stride length Ambulation Distance (Feet): 120 Feet (2x's) Assistive device: Rolling walker Gait Pattern: Step-through pattern;Decreased step length - right;Decreased step length - left;Decreased stride length Gait velocity: slow gait velocity Stairs: Yes Stairs Assistance: Other (comment) (Min Guard (A)) Stairs Assistance Details (indicate cue type and reason): cues for sequencing, technique, reinforcement of back precautions Stair Management Technique: Two rails Number of Stairs: 3  (2x's) Wheelchair Mobility Wheelchair Mobility: No    Exercise    End of Session PT - End of Session Equipment Utilized During Treatment: Gait belt;Back brace Activity Tolerance: Patient tolerated treatment well Patient left: in chair;with call bell in reach General Behavior During Session: [facility] for tasks performed Cognition: [facility] for tasks performed  Lara Mulch 10/09/2011, 12:43 PM [phone]

## 2011-10-09 NOTE — Progress Notes (Signed)
Occupational Therapy Evaluation Patient Details Name: [name] MRN: [id number] DOB: 01-May-1935 Today's Date: 10/09/2011  Problem List:  Patient Active Problem List  Diagnoses  . DM w/o Complication Type II  . HYPERLIPIDEMIA  . UNSPECIFIED CATARACT  . HYPERTENSION  . ALLERGIC RHINITIS, SEASONAL  . ASTHMA  . IBS  . GI BLEEDING  . OSTEOPOROSIS  . DIARRHEA  . ABNORMAL ELECTROCARDIOGRAM  . HX, PERSONAL, COLONIC POLYPS  . HX, PERSONAL, ARTHRITIS  . Pneumonia  . Back pain  . Rash  . Diskitis  . Liver abscess  . Neuropathy  . Weight loss, unintentional  . Spinal stenosis of lumbar region with neurogenic claudication    Past Medical History:  Past Medical History  Diagnosis Date  . Hyperlipidemia   . Osteoporosis   . Hyperplastic polyps of stomach   . Hypertension     on meds  . Diabetes mellitus     Type 2 niddm x 2004- diet controlled  . Asthma     uses advair prn  . Renal insufficiency     h/o proteinuria; resolved  . Arthritis    Past Surgical History:  Past Surgical History  Procedure Date  . Cesarean section     x2  . Appendectomy 1964  . Back surgery 2004  . Back surgery 1967    OT Assessment/Plan/Recommendation OT Assessment Clinical Impression Statement: Patient will benefit from skilled OT in the acute setting to maximize independence with ADL and ADL mobility to facilitate safe d/c home. OT Recommendation/Assessment: Patient will need skilled OT in the acute care venue OT Problem List: Decreased activity tolerance;Decreased knowledge of use of DME or AE;Decreased knowledge of precautions;Pain OT Therapy Diagnosis : Acute pain OT Plan OT Frequency: Min 2X/week OT Treatment/Interventions: Self-care/ADL training;Therapeutic activities;Patient/family education OT Recommendation Follow Up Recommendations: None Equipment Recommended: None recommended by OT Individuals Consulted Consulted and Agree with Results and Recommendations: Patient OT  Goals Acute Rehab OT Goals OT Goal Formulation: With patient Time For Goal Achievement: 7 days ADL Goals Pt Will Perform Lower Body Dressing: with modified independence;with adaptive equipment;Sit to stand from chair ADL Goal: Lower Body Dressing - Progress: Progressing toward goals Pt Will Perform Tub/Shower Transfer: Tub transfer;Transfer tub bench;Shower seat without back;Ambulation;with modified independence ADL Goal: Tub/Shower Transfer - Progress: Other (comment) Miscellaneous OT Goals Miscellaneous OT Goal #1: Pt will independently verbalize and generalize 3/3 back precautions. OT Goal: Miscellaneous Goal #1 - Progress: Other (comment)  OT Evaluation Precautions/Restrictions  Precautions Precautions: Back Precaution Comments: Pt independently verbalized 3/3 back precautions but requires mod cueing throughout session to reinforce precautions.   Required Braces or Orthoses: Yes Spinal Brace: Lumbar corset;Applied in sitting position (pt sitting in chair with brace on upon therapist arrival) Restrictions Weight Bearing Restrictions: No ADL ADL Eating/Feeding: Simulated;Independent Where Assessed - Eating/Feeding: Chair Grooming: Performed;Wash/dry hands;Independent Where Assessed - Grooming: Standing at sink Upper Body Bathing: Simulated;Modified independent Where Assessed - Upper Body Bathing: Sitting, chair Lower Body Bathing: Simulated;Minimal assistance Where Assessed - Lower Body Bathing: Sit to stand in shower Upper Body Dressing: Simulated;Set up;Supervision/safety Upper Body Dressing Details (indicate cue type and reason): VC to maintain back precautions Where Assessed - Upper Body Dressing: Sitting, chair Lower Body Dressing: Performed;Minimal assistance Where Assessed - Lower Body Dressing: Sit to stand from chair Toilet Transfer: Performed;Supervision/safety Toilet Transfer Method: Proofreader: Regular height toilet;Grab bars Toileting -  Clothing Manipulation: Performed;Supervision/safety Where Assessed - Toileting Clothing Manipulation: Sit to stand from 3-in-1 or toilet Toileting - Hygiene: Performed;Modified  independent Where Assessed - Toileting Hygiene: Sit on 3-in-1 or toilet ADL Comments: Patient was in hospital this past year for previous back surgery. Spoke at length with patient re: how to transfer safely into/out of tub. Patient stated HHOT came to house and made suggestions that she did not end up using. Patient does own tub bench- therefore suggested patient utilize tub bench initially and transition back to tub seat as she becomes more independent and safe with transfers. Will need to practice tub seat and tub bench transfer prior to d/c. Vision/Perception  Vision - History Baseline Vision: Wears glasses all the time Patient Visual Report: No change from baseline   Sensation/Coordination Sensation Light Touch: Appears Intact Coordination Gross Motor Movements are Fluid and Coordinated: Yes Fine Motor Movements are Fluid and Coordinated: Yes Extremity Assessment RUE Assessment RUE Assessment: Within Functional Limits LUE Assessment LUE Assessment: Within Functional Limits Mobility  Sit to Stand: 5: Supervision;From toilet;From bed;With armrests;With upper extremity assist;Sit to Stand Details (indicate cue type and reason): cues to remind pt of hand placement Stand to Sit: 5: Supervision;To chair/3-in-1;To toilet Stand to Sit Details: cues for hand placement   End of Session OT - End of Session Equipment Utilized During Treatment: Gait belt;Back brace Activity Tolerance: Patient tolerated treatment well Patient left: in chair;with call bell in reach General Behavior During Session: [facility] for tasks performed Cognition: [facility] for tasks performed   [name] 10/09/2011, 12:44 PM

## 2011-10-10 LAB — GLUCOSE, CAPILLARY
Glucose-Capillary: 125 mg/dL — ABNORMAL HIGH (ref 70–99)
Glucose-Capillary: 236 mg/dL — ABNORMAL HIGH (ref 70–99)

## 2011-10-10 LAB — BASIC METABOLIC PANEL
BUN: 24 mg/dL — ABNORMAL HIGH (ref 6–23)
CO2: 27 mEq/L (ref 19–32)
Chloride: 104 mEq/L (ref 96–112)
Creatinine, Ser: 1.34 mg/dL — ABNORMAL HIGH (ref 0.50–1.10)

## 2011-10-10 LAB — CBC
HCT: 27.1 % — ABNORMAL LOW (ref 36.0–46.0)
MCV: 92.5 fL (ref 78.0–100.0)
RBC: 2.93 MIL/uL — ABNORMAL LOW (ref 3.87–5.11)
WBC: 9.7 10*3/uL (ref 4.0–10.5)

## 2011-10-10 MED ORDER — DIAZEPAM 5 MG PO TABS: 5.0000 mg | ORAL_TABLET | Freq: Four times a day (QID) | ORAL | Status: AC | PRN

## 2011-10-10 MED ORDER — HYDROMORPHONE HCL 2 MG PO TABS: 2.0000 mg | ORAL_TABLET | ORAL | Status: AC | PRN

## 2011-10-10 MED ORDER — OXYCODONE-ACETAMINOPHEN 5-325 MG PO TABS: 1.0000 | ORAL_TABLET | Freq: Four times a day (QID) | ORAL | Status: AC | PRN

## 2011-10-10 NOTE — Progress Notes (Signed)
Spoke with patient regarding home health. She prefers [facility]. Entered in [facility].

## 2011-10-10 NOTE — Plan of Care (Signed)
Problem: Phase II Progression Outcomes Goal: Progress activity as tolerated unless otherwise ordered Outcome: Completed/Met Date Met:  10/10/11 Pt ambulates down hall well. Goal: Understands assist devices with ambulation Outcome: Completed/Met Date Met:  10/10/11 Front wheel walker

## 2011-10-10 NOTE — Progress Notes (Signed)
Physical Therapy Treatment Patient Details Name: [name] MRN: [id number] DOB: Jul 14, 1935 Today's Date: 10/10/2011  PT Assessment/Plan  PT - Assessment/Plan Comments on Treatment Session: Pt performed bed mobility with decreased cueing for back precautions but still needed cues to maintain upright posture with ambulation & while standing at sink to wash hands & brush teeth.   PT Plan: Discharge plan remains appropriate PT Frequency: Min 6X/week Follow Up Recommendations: Home health PT Equipment Recommended: None recommended by PT PT Goals  Acute Rehab PT Goals PT Goal: Rolling Supine to Right Side - Progress: Met PT Goal: Supine/Side to Sit - Progress: Met PT Goal: Sit to Stand - Progress: Not met PT Goal: Stand to Sit - Progress: Not met PT Goal: Ambulate - Progress: Progressing toward goal PT Goal: Up/Down Stairs - Progress:  (Not addressed today)  PT Treatment Precautions/Restrictions  Precautions Precautions: Back Precaution Comments: Pt independently verbalized 3/3 back precautions but requires mod cueing throughout session to reinforce precautions.   Required Braces or Orthoses: Yes Spinal Brace: Lumbar corset;Applied in sitting position (Pt donned back brace with Min (A)) Restrictions Weight Bearing Restrictions: No Mobility (including Balance) Bed Mobility Bed Mobility:  (Increased time for all components of bed mobility) Rolling Right: 6: Modified independent (Device/Increase time);With rail Rolling Left: 6: Modified independent (Device/Increase time) Right Sidelying to Sit: 6: Modified independent (Device/Increase time);With rails Sitting - Scoot to Edge of Bed: 6: Modified independent (Device/Increase time) Transfers Sit to Stand: 5: Supervision;From bed;From toilet;From chair/3-in-1;With armrests;With upper extremity assist (mat table) Sit to Stand Details (indicate cue type and reason): Cues to remind pt of hand placement- wants to pull up to standing with UE's on  RW Stand to Sit: 5: Supervision;To toilet;To chair/3-in-1;With armrests;Without upper extremity assist (mat table) Stand to Sit Details: Cues for hand placement- keeps hands on RW Ambulation/Gait Ambulation/Gait Assistance: 5: Supervision Ambulation/Gait Assistance Details (indicate cue type and reason): increase/maintain upright posture; pt required seated rest break due to increase pain/fatigue per pt; pt with shuffle-like steps- cues to increase step/stride length.   Ambulation Distance (Feet): 150 Feet (2x's.  ) Assistive device: Rolling walker Gait Pattern: Step-through pattern;Decreased step length - right;Decreased step length - left;Decreased stride length;Trunk flexed Gait velocity: slow gait velocity Stairs: No Wheelchair Mobility Wheelchair Mobility: No  Posture/Postural Control Posture/Postural Control: Postural limitations Postural Limitations: difficulty maintaining upright posture Exercise  Total Joint Exercises Ankle Circles/Pumps: AROM;15 reps;Both;Supine Quad Sets: AROM;5 reps;Supine;Both  Gluteal Sets: AROM;Both;5 reps;Supine Heel Slides: AROM;Both;10 reps;Supine Long Arc Quad: AROM;Both;10 reps;Seated **Pt c/o of increased pain in back with quad/glute sets- exercise terminated** End of Session PT - End of Session Equipment Utilized During Treatment: Gait belt;Back brace Activity Tolerance: Patient tolerated treatment well Patient left: in chair;with call bell in reach General Behavior During Session: [facility] for tasks performed Cognition: [facility] for tasks performed  Lara Mulch 10/10/2011, 11:10 AM [phone]

## 2011-10-10 NOTE — Progress Notes (Signed)
Pt in no Acute distress. Pt IV D/C. Discharge instructions and education reviewed. No further questions by patient. KCrotts, Charity fundraiser

## 2011-10-10 NOTE — Progress Notes (Signed)
Occupational Therapy Treatment Patient Details Name: [name] MRN: [id number] DOB: 08-20-1935 Today's Date: 10/10/2011  OT Assessment/Plan OT Assessment/Plan OT Plan: Discharge plan remains appropriate Follow Up Recommendations: None Equipment Recommended: None recommended by PT;None recommended by OT OT Goals ADL Goals ADL Goal: Tub/Shower Transfer - Progress: Progressing toward goals Miscellaneous OT Goals OT Goal: Miscellaneous Goal #1 - Progress: Progressing toward goals  OT Treatment Precautions/Restrictions  Precautions Precautions: Back Spinal Brace: Lumbar corset;Applied in sitting position (donned and doffed independently)   ADL ADL Toilet Transfer: Performed;Modified independent Toilet Transfer Method: Proofreader: Regular height toilet;Grab bars Toileting - Clothing Manipulation: Performed;Independent Where Assessed - Toileting Clothing Manipulation: Standing Toileting - Hygiene: Performed;Modified independent (maintaining back precautions) Where Assessed - Toileting Hygiene: Sit on 3-in-1 or toilet Tub/Shower Transfer: Simulated;Minimal assistance Tub/Shower Transfer Details (indicate cue type and reason): Dtr present for this portion of session. After much problem solving and discussion regarding best possible method for tub/shower transfer, educated patient and dtr to perform tub/shower transfer with RW on left and dtr supporting patient RUE- side step into tub then sit on tub seat. reverse for tub/shower exit. Pt and dtr verbalized understanding. Tub/Shower Transfer Method:  (see above) Psychologist, educational: Shower seat without back Equipment Used: Horticulturist, commercial  Bed Mobility Rolling Right: 6: Modified independent (Device/Increase time);With rail Right Sidelying to Sit: 6: Modified independent (Device/Increase time);With rails;HOB flat Sitting - Scoot to Edge of Bed: 7: Independent  End of Session OT - End of  Session Equipment Utilized During Treatment: Gait belt;Back brace Activity Tolerance: Patient limited by pain Patient left: in bed;with family/visitor present;with call bell in reach Nurse Communication: Mobility status for transfers;Mobility status for ambulation General Behavior During Session: [facility] for tasks performed Cognition: [facility] for tasks performed  [name]  10/10/2011, 3:28 PM

## 2011-10-10 NOTE — Discharge Summary (Signed)
Physician Discharge Summary  Patient ID: [name] MRN: [id number] DOB/AGE: [birth year] 76 y.o.  Admit date: 10/07/2011 Discharge date: 10/10/2011  Admission Diagnoses:  Discharge Diagnoses:  Principal Problem:  *Spinal stenosis of lumbar region with neurogenic claudication   Discharged Condition: good  Hospital Course: Patient is admitted to the hospital where she underwent uncomplicated L3-4 decompression and fusion with instrumentation. Postoperatively she has done well. She has awakened with intact motor function of both lower cavities. She has chronic stable peripheral neuropathy involving both distal lower cavities with continued numbness and paresthesias. She has some mild radicular pain in her right anterior thigh. Her wound is healing well. Bowel and bladder function are okay. Plan is for discharge home.   Consults: none  Significant Diagnostic Studies: None  Treatments: surgery: L3-4 decompressive laminectomy. L3-4-5 posterior lateral arthrodesis utilizing segmental pedicle screw station and local autographing with bone graft  extender and BMP  Discharge Exam: Blood pressure 125/76, pulse 84, temperature 99 F (37.2 C), temperature source Oral, resp. rate 18, height 4\' 10"  (1.473 m), weight 58.968 kg (130 lb), SpO2 98.00%. She is awake and alert she is oriented and appropriate. Cranial nerve function intact. Motor examination is intact in both upper and lower teres. Sensory examination a stocking sensory loss involving her mid leg through her feet bilaterally. Wound is healing well. Straight leg raising is negative. Chest and abdominal examinations are benign.  Disposition: Home-Health Care Svc  Discharge Orders    Future Appointments: Provider: Department: Dept Phone: Center:   11/20/2011 11:00 AM Lelon Perla, DO [facility] [phone] [facility]     Future Orders Please Complete By Expires   Home Health      Questions: Responses:   To provide the following  care/treatments PT   Face-to-face encounter      Comments:   I [name] A certify that this patient is under my care and that I, or a nurse practitioner or physician's assistant working with me, had a face-to-face encounter that meets the physician face-to-face encounter requirements with this patient on 10/10/2011.       Questions: Responses:   The encounter with the patient was in whole, or in part, for the following medical condition, which is the primary reason for home health care yes   I certify that, based on my findings, the following services are medically necessary home health services Physical therapy   My clinical findings support the need for the above services Patients with multiple co-morbidities (wounds and uncontrolled DM for example)    High Risk for rehospitalization   Further, I certify that my clinical findings support that this patient is homebound (i.e. absences from home require considerable and taxing effort and are for medical reasons or religious services or infrequently or of short duration when for other reasons) Pain interferes with ambulation/mobility   To provide the following care/treatments PT     Current Discharge Medication List    START taking these medications   Details  diazepam (VALIUM) 5 MG tablet Take 1-2 tablets (5-10 mg total) by mouth every 6 (six) hours as needed (spasms). Qty: 60 tablet, Refills: 1    HYDROmorphone (DILAUDID) 2 MG tablet Take 1-2 tablets (2-4 mg total) by mouth every 4 (four) hours as needed for pain. Qty: 80 tablet, Refills: 0      CONTINUE these medications which have CHANGED   Details  oxyCODONE-acetaminophen (PERCOCET) 5-325 MG per tablet Take 1-2 tablets by mouth every 6 (six) hours as needed for pain. Qty: 80  tablet, Refills: 0      CONTINUE these medications which have NOT CHANGED   Details  albuterol (PROAIR HFA) 108 (90 BASE) MCG/ACT inhaler Inhale 2 puffs into the lungs every 4 (four) hours as needed. For  shortness of breath.    Ascorbic Acid (VITAMIN C) 500 MG tablet Take 1,000 mg by mouth 2 (two) times daily.     atenolol (TENORMIN) 100 MG tablet Take 1 tablet (100 mg total) by mouth daily. Qty: 90 tablet, Refills: 3   Associated Diagnoses: Unspecified essential hypertension    Calcium Citrate-Vitamin D (CALCIUM CITRATE + PO) Take 2 tablets by mouth 2 (two) times daily.      cholecalciferol (VITAMIN D) 1000 UNITS tablet Take 1,000 Units by mouth daily.      clotrimazole-betamethasone (LOTRISONE) cream Apply 1 application topically 2 (two) times daily as needed. For rash.     cyclobenzaprine (FLEXERIL) 5 MG tablet Take 5 mg by mouth 3 (three) times daily as needed. For muscle spasm.     diphenhydrAMINE (SOMINEX) 25 MG tablet Take 50 mg by mouth at bedtime as needed. For allergies.     fenofibrate 54 MG tablet Take 1 tablet (54 mg total) by mouth daily. Qty: 90 tablet, Refills: 3   Associated Diagnoses: Other and unspecified hyperlipidemia    Fluticasone-Salmeterol (ADVAIR DISKUS) 250-50 MCG/DOSE AEPB Inhale 1 puff into the lungs 2 (two) times daily.     indapamide (LOZOL) 1.25 MG tablet Take 1 tablet (1.25 mg total) by mouth daily. Qty: 90 tablet, Refills: 3   Associated Diagnoses: Unspecified essential hypertension    losartan (COZAAR) 50 MG tablet Take 1 tablet (50 mg total) by mouth daily. Qty: 90 tablet, Refills: 1    methocarbamol (ROBAXIN) 500 MG tablet Take 500 mg by mouth daily as needed. For back spasm.     Multiple Vitamin (MULTIVITAMIN) tablet Take 1 tablet by mouth daily.      omeprazole (PRILOSEC) 20 MG capsule Take 20 mg by mouth daily.      promethazine (PHENERGAN) 25 MG tablet Take 25 mg by mouth daily as needed. For nausea.     ezetimibe-simvastatin (VYTORIN) 10-20 MG per tablet Take 1 tablet every 3 days     Fish Oil OIL Take 1 tablet by mouth 3 (three) times daily.     Flaxseed, Linseed, (FLAX SEED OIL PO) Take 1 tablet by mouth daily.     glucose blood  test strip Check blood sugars qid Qty: 100 each, Refills: 12   Associated Diagnoses: Diabetes mellitus type II    hyoscyamine (LEVSIN SL) 0.125 MG SL tablet Place 0.125 mg under the tongue every 4 (four) hours as needed. For diarrhea.    traMADol (ULTRAM) 50 MG tablet Take 50 mg by mouth every 6 (six) hours as needed. For pain.    vitamin E 400 UNIT capsule Take 400 Units by mouth daily.         Follow-up Information    Call Armanie Ullmer A.   Contact information:   [address] [place] [address] [phone]          Signed: Temple Pacini 10/10/2011, 10:00 AM

## 2011-10-11 ENCOUNTER — Encounter: Payer: Self-pay | Admitting: Gastroenterology

## 2011-10-11 MED FILL — Heparin Sodium (Porcine) Inj 1000 Unit/ML: INTRAMUSCULAR | Qty: 30 | Status: AC

## 2011-10-11 MED FILL — Sodium Chloride Irrigation Soln 0.9%: Qty: 3000 | Status: AC

## 2011-10-11 MED FILL — Sodium Chloride IV Soln 0.9%: INTRAVENOUS | Qty: 1000 | Status: AC

## 2011-10-16 ENCOUNTER — Ambulatory Visit: Payer: Medicare HMO | Admitting: Family Medicine

## 2011-11-13 ENCOUNTER — Ambulatory Visit
Admission: RE | Admit: 2011-11-13 | Discharge: 2011-11-13 | Disposition: A | Discharge status: Home / Self Care | Payer: Medicare HMO | Source: Ambulatory Visit | Attending: Neurosurgery | Admitting: Neurosurgery

## 2011-11-13 ENCOUNTER — Other Ambulatory Visit: Payer: Self-pay | Admitting: Neurosurgery

## 2011-11-13 ENCOUNTER — Other Ambulatory Visit (INDEPENDENT_AMBULATORY_CARE_PROVIDER_SITE_OTHER): Payer: Medicare HMO

## 2011-11-13 ENCOUNTER — Other Ambulatory Visit: Payer: Self-pay | Admitting: Family Medicine

## 2011-11-13 DIAGNOSIS — E119 Type 2 diabetes mellitus without complications: Secondary | ICD-10-CM

## 2011-11-13 DIAGNOSIS — M545 Low back pain: Secondary | ICD-10-CM

## 2011-11-13 DIAGNOSIS — E785 Hyperlipidemia, unspecified: Secondary | ICD-10-CM

## 2011-11-13 LAB — LIPID PANEL
Total CHOL/HDL Ratio: 5
Triglycerides: 213 mg/dL — ABNORMAL HIGH (ref 0.0–149.0)

## 2011-11-13 LAB — BASIC METABOLIC PANEL
BUN: 15 mg/dL (ref 6–23)
CO2: 27 mEq/L (ref 19–32)
Calcium: 10 mg/dL (ref 8.4–10.5)
Creatinine, Ser: 1.1 mg/dL (ref 0.4–1.2)
GFR: 51.6 mL/min — ABNORMAL LOW (ref >60.00)
Glucose, Bld: 101 mg/dL — ABNORMAL HIGH (ref 70–99)
Sodium: 144 mEq/L (ref 135–145)

## 2011-11-13 LAB — HEPATIC FUNCTION PANEL
ALT: 19 U/L (ref 0–35)
AST: 39 U/L — ABNORMAL HIGH (ref 0–37)
Alkaline Phosphatase: 64 U/L (ref 39–117)
Bilirubin, Direct: 0.2 mg/dL (ref 0.0–0.3)
Total Bilirubin: 0.7 mg/dL (ref 0.3–1.2)

## 2011-11-20 ENCOUNTER — Ambulatory Visit: Payer: Medicare HMO | Admitting: Family Medicine

## 2011-11-27 ENCOUNTER — Ambulatory Visit (INDEPENDENT_AMBULATORY_CARE_PROVIDER_SITE_OTHER): Payer: Medicare HMO | Admitting: Family Medicine

## 2011-11-27 ENCOUNTER — Encounter: Payer: Self-pay | Admitting: Family Medicine

## 2011-11-27 VITALS — BP 120/74 | HR 64 | Temp 97.6°F | Wt 137.8 lb

## 2011-11-27 DIAGNOSIS — R110 Nausea: Secondary | ICD-10-CM

## 2011-11-27 DIAGNOSIS — I10 Essential (primary) hypertension: Secondary | ICD-10-CM

## 2011-11-27 DIAGNOSIS — E1142 Type 2 diabetes mellitus with diabetic polyneuropathy: Secondary | ICD-10-CM

## 2011-11-27 DIAGNOSIS — G909 Disorder of the autonomic nervous system, unspecified: Secondary | ICD-10-CM

## 2011-11-27 DIAGNOSIS — E1149 Type 2 diabetes mellitus with other diabetic neurological complication: Secondary | ICD-10-CM

## 2011-11-27 DIAGNOSIS — E119 Type 2 diabetes mellitus without complications: Secondary | ICD-10-CM

## 2011-11-27 DIAGNOSIS — E785 Hyperlipidemia, unspecified: Secondary | ICD-10-CM

## 2011-11-27 MED ORDER — ATORVASTATIN CALCIUM 10 MG PO TABS: 10.0000 mg | ORAL_TABLET | Freq: Every day | ORAL | Status: DC

## 2011-11-27 MED ORDER — PROMETHAZINE HCL 25 MG PO TABS: ORAL_TABLET | ORAL | Status: DC

## 2011-11-27 MED ORDER — EZETIMIBE-SIMVASTATIN 10-40 MG PO TABS: 1.0000 | ORAL_TABLET | Freq: Every day | ORAL | Status: DC

## 2011-11-27 NOTE — Patient Instructions (Signed)

## 2011-11-28 NOTE — Assessment & Plan Note (Signed)
Controlled con't meds  

## 2011-11-28 NOTE — Progress Notes (Signed)
  Subjective:    Patient ID: [name], female    DOB: 1934/11/11, 77 y.o.   MRN: [id number]  HPI  Pt here to review labs only.  No other new complaints. HYPERTENSION Disease Monitoring Blood pressure range-not checking Chest pain- no      Dyspnea- no Medications Compliance- good Lightheadedness- no   Edema- no   DIABETES Disease Monitoring Blood Sugar ranges-excellent Polyuria- no New Visual problems- no Medications Compliance- good Hypoglycemic symptoms- no   HYPERLIPIDEMIA Disease Monitoring See symptoms for Hypertension Medications Compliance- poor RUQ pain- no  Muscle aches- yes on statin  ROS See HPI above   PMH Smoking Status noted   Review of Systems As above    Objective:   Physical Exam  Constitutional: She is oriented to person, place, and time. She appears well-developed and well-nourished.  Cardiovascular: Normal rate, regular rhythm and normal heart sounds.   No murmur heard. Pulmonary/Chest: Breath sounds normal. No respiratory distress. She has no wheezes. She has no rales. She exhibits no tenderness.  Neurological: She is alert and oriented to person, place, and time.  Psychiatric: She has a normal mood and affect. Her behavior is normal. Judgment and thought content normal.  monofilament--  Pt unable to feel monofilament----no change        Assessment & Plan:

## 2011-11-28 NOTE — Assessment & Plan Note (Signed)
Start on lipitor and take with CoQ10 to hopefully decrease muscle aches Recheck labs 3 months

## 2011-12-04 ENCOUNTER — Telehealth: Payer: Self-pay | Admitting: *Deleted

## 2011-12-04 NOTE — Telephone Encounter (Signed)
Prior Auth approved until 01-03-12, approval letter scan to chart.

## 2011-12-11 ENCOUNTER — Other Ambulatory Visit: Payer: Self-pay | Admitting: Neurosurgery

## 2011-12-11 DIAGNOSIS — M545 Low back pain: Secondary | ICD-10-CM

## 2011-12-26 ENCOUNTER — Inpatient Hospital Stay: Admission: RE | Admit: 2011-12-26 | Payer: Medicare HMO | Source: Ambulatory Visit

## 2012-01-08 ENCOUNTER — Other Ambulatory Visit: Payer: Self-pay | Admitting: Family Medicine

## 2012-01-08 DIAGNOSIS — E785 Hyperlipidemia, unspecified: Secondary | ICD-10-CM

## 2012-01-08 DIAGNOSIS — E119 Type 2 diabetes mellitus without complications: Secondary | ICD-10-CM

## 2012-02-02 ENCOUNTER — Other Ambulatory Visit: Payer: Self-pay | Admitting: Family Medicine

## 2012-02-06 ENCOUNTER — Other Ambulatory Visit: Payer: Self-pay | Admitting: Family Medicine

## 2012-02-10 ENCOUNTER — Telehealth: Payer: Self-pay | Admitting: Family Medicine

## 2012-02-11 NOTE — Telephone Encounter (Signed)
Future order in the system.      KP

## 2012-02-12 ENCOUNTER — Other Ambulatory Visit: Payer: Self-pay | Admitting: Family Medicine

## 2012-02-12 ENCOUNTER — Ambulatory Visit: Payer: Medicare HMO | Admitting: Family Medicine

## 2012-03-04 ENCOUNTER — Other Ambulatory Visit (INDEPENDENT_AMBULATORY_CARE_PROVIDER_SITE_OTHER): Payer: Medicare HMO

## 2012-03-04 DIAGNOSIS — E119 Type 2 diabetes mellitus without complications: Secondary | ICD-10-CM

## 2012-03-04 DIAGNOSIS — E785 Hyperlipidemia, unspecified: Secondary | ICD-10-CM

## 2012-03-04 LAB — HEPATIC FUNCTION PANEL
ALT: 13 U/L (ref 0–35)
AST: 27 U/L (ref 0–37)
Alkaline Phosphatase: 55 U/L (ref 39–117)
Bilirubin, Direct: 0.1 mg/dL (ref 0.0–0.3)
Total Bilirubin: 0.7 mg/dL (ref 0.3–1.2)

## 2012-03-04 LAB — BASIC METABOLIC PANEL
CO2: 27 mEq/L (ref 19–32)
Calcium: 9.6 mg/dL (ref 8.4–10.5)
Chloride: 104 mEq/L (ref 96–112)
Creatinine, Ser: 1.1 mg/dL (ref 0.4–1.2)
Glucose, Bld: 113 mg/dL — ABNORMAL HIGH (ref 70–99)

## 2012-03-04 LAB — HEMOGLOBIN A1C: Hgb A1c MFr Bld: 7 % — ABNORMAL HIGH (ref 4.6–6.5)

## 2012-03-04 LAB — LIPID PANEL: Total CHOL/HDL Ratio: 4

## 2012-03-11 ENCOUNTER — Ambulatory Visit: Payer: Medicare HMO | Admitting: Family Medicine

## 2012-03-13 MED ORDER — GLIMEPIRIDE 2 MG PO TABS: 2.0000 mg | ORAL_TABLET | Freq: Every day | ORAL | Status: DC

## 2012-03-25 ENCOUNTER — Other Ambulatory Visit: Payer: Self-pay | Admitting: Family Medicine

## 2012-04-01 ENCOUNTER — Encounter: Payer: Self-pay | Admitting: Family Medicine

## 2012-04-01 ENCOUNTER — Ambulatory Visit (INDEPENDENT_AMBULATORY_CARE_PROVIDER_SITE_OTHER): Payer: Medicare HMO | Admitting: Family Medicine

## 2012-04-01 DIAGNOSIS — J45909 Unspecified asthma, uncomplicated: Secondary | ICD-10-CM

## 2012-04-01 DIAGNOSIS — G629 Polyneuropathy, unspecified: Secondary | ICD-10-CM

## 2012-04-01 DIAGNOSIS — G909 Disorder of the autonomic nervous system, unspecified: Secondary | ICD-10-CM

## 2012-04-01 DIAGNOSIS — E119 Type 2 diabetes mellitus without complications: Secondary | ICD-10-CM

## 2012-04-01 DIAGNOSIS — R110 Nausea: Secondary | ICD-10-CM

## 2012-04-01 DIAGNOSIS — I10 Essential (primary) hypertension: Secondary | ICD-10-CM

## 2012-04-01 DIAGNOSIS — E1142 Type 2 diabetes mellitus with diabetic polyneuropathy: Secondary | ICD-10-CM

## 2012-04-01 DIAGNOSIS — E1149 Type 2 diabetes mellitus with other diabetic neurological complication: Secondary | ICD-10-CM

## 2012-04-01 DIAGNOSIS — E785 Hyperlipidemia, unspecified: Secondary | ICD-10-CM

## 2012-04-01 DIAGNOSIS — G609 Hereditary and idiopathic neuropathy, unspecified: Secondary | ICD-10-CM

## 2012-04-01 DIAGNOSIS — G8929 Other chronic pain: Secondary | ICD-10-CM

## 2012-04-01 MED ORDER — PROMETHAZINE HCL 25 MG PO TABS: ORAL_TABLET | ORAL | Status: AC

## 2012-04-01 MED ORDER — GABAPENTIN 100 MG PO CAPS: 100.0000 mg | ORAL_CAPSULE | Freq: Three times a day (TID) | ORAL | Status: DC

## 2012-04-01 MED ORDER — ALBUTEROL SULFATE HFA 108 (90 BASE) MCG/ACT IN AERS: 2.0000 | INHALATION_SPRAY | RESPIRATORY_TRACT | Status: DC | PRN

## 2012-04-01 MED ORDER — OXYCODONE-ACETAMINOPHEN 5-325 MG PO TABS: 1.0000 | ORAL_TABLET | Freq: Three times a day (TID) | ORAL | Status: AC | PRN

## 2012-04-01 MED ORDER — COQ10 200 MG PO CAPS: ORAL_CAPSULE | ORAL | Status: DC

## 2012-04-01 MED ORDER — CYCLOBENZAPRINE HCL 5 MG PO TABS: 5.0000 mg | ORAL_TABLET | Freq: Three times a day (TID) | ORAL | Status: DC | PRN

## 2012-04-01 NOTE — Patient Instructions (Signed)

## 2012-04-01 NOTE — Progress Notes (Signed)
  Subjective:    Patient ID: [name], female    DOB: Jun 05, 1935, 77 y.o.   MRN: [id number]  HPI Pt here for f/u.   HYPERTENSION Disease Monitoring Blood pressure range-not checked Chest pain- no      Dyspnea- no Medications Compliance- good Lightheadedness- no   Edema- no   DIABETES Disease Monitoring Blood Sugar ranges-good Polyuria- no New Visual problems- no Medications Compliance- good Hypoglycemic symptoms- no   HYPERLIPIDEMIA Disease Monitoring See symptoms for Hypertension Medications Compliance- good RUQ pain- no  Muscle aches- yes but much better with co q 10  ROS See HPI above   PMH Smoking Status noted     Review of Systems As above Objective:   Physical Exam  Constitutional: She is oriented to person, place, and time. She appears well-developed and well-nourished.  Cardiovascular: Normal rate, regular rhythm and normal heart sounds.   No murmur heard. Pulmonary/Chest: Effort normal and breath sounds normal. No respiratory distress. She has no wheezes. She has no rales.  Musculoskeletal:       Sensory exam of the foot is normal, tested with the monofilament. Good pulses, no lesions or ulcers, good peripheral pulses.   Neurological: She is alert and oriented to person, place, and time.  Psychiatric: She has a normal mood and affect. Her behavior is normal. Judgment and thought content normal.          Assessment & Plan:

## 2012-04-02 NOTE — Assessment & Plan Note (Signed)
Stable con't meds 

## 2012-04-02 NOTE — Assessment & Plan Note (Signed)
Check labs  con't med 

## 2012-04-02 NOTE — Assessment & Plan Note (Signed)
Reviewed labs Cont meds Recheck 3 months

## 2012-05-07 ENCOUNTER — Other Ambulatory Visit: Payer: Self-pay | Admitting: Family Medicine

## 2012-05-08 ENCOUNTER — Other Ambulatory Visit: Payer: Self-pay | Admitting: Family Medicine

## 2012-05-08 NOTE — Telephone Encounter (Signed)
Refill done.  

## 2012-05-08 NOTE — Telephone Encounter (Signed)
losartan potassium 50 mg #90, take one tablet once daily, last fill 4.1.13, last ov 5.29.13

## 2012-05-11 MED ORDER — LOSARTAN POTASSIUM 50 MG PO TABS: 50.0000 mg | ORAL_TABLET | Freq: Every day | ORAL | Status: DC

## 2012-05-12 IMAGING — CR DG CHEST 1V PORT
1 series · 1 of 1 positions shown · non-contrast
Comparison: 03/18/2011 and 03/17/2011.

CLINICAL DATA: Stroke.  Follow-up.

PORTABLE CHEST - 1 VIEW

[view not recorded]
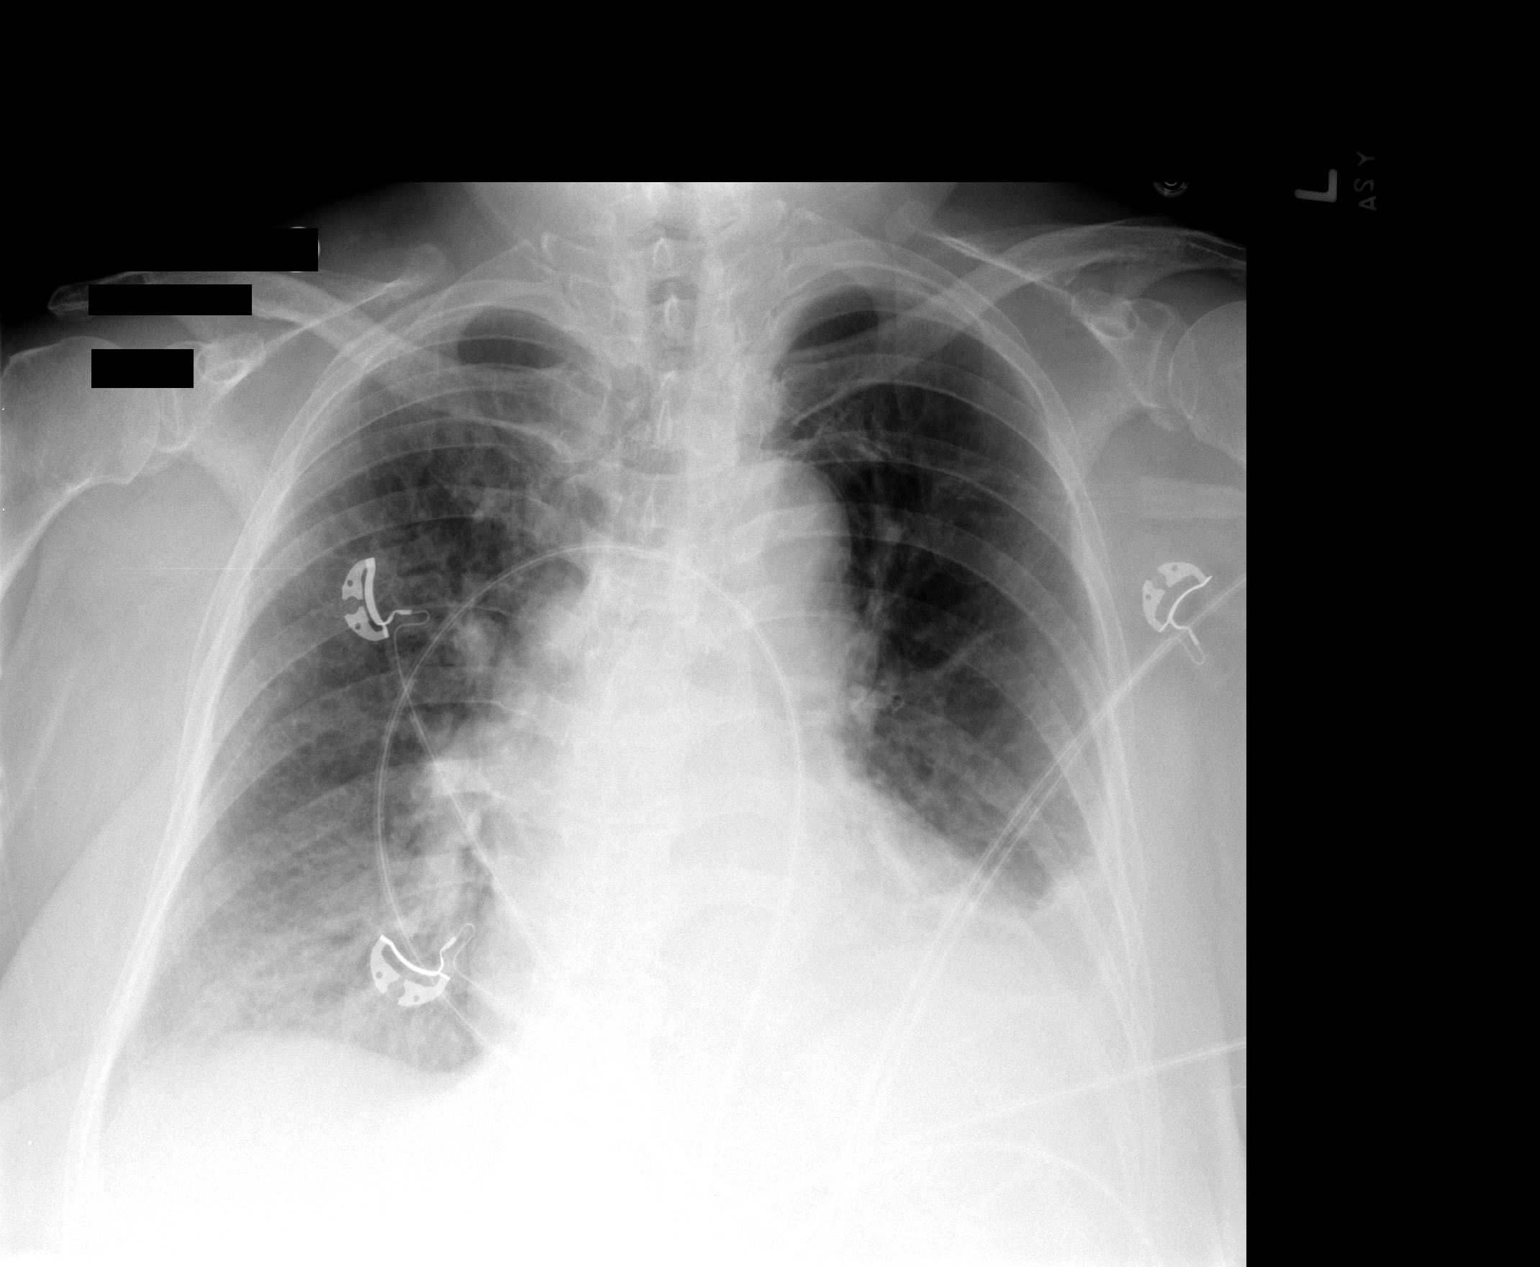

[1 of 1 positions shown; findings below may reference images not displayed]

FINDINGS: 5555 hours.  There is worsening bibasilar aeration with a
probable developing left pleural effusion.  Mild pulmonary edema is
suspected.  The heart size and mediastinal contours are stable.
There is no pneumothorax.
IMPRESSION: Suspected edema with worsening basilar air space opacities and left
pleural effusion.

## 2012-05-12 IMAGING — CT CT ABD-PELV W/ CM
2 of 5 series · 16 of 46 positions shown, 18 images · IV contrast (agent unspecified)
Comparison: None.

CT CHEST

CLINICAL DATA: Rule out infection.  Spinal infection.

CT CHEST, ABDOMEN AND PELVIS WITH CONTRAST
TECHNIQUE: Multidetector CT imaging of the chest, abdomen and
pelvis was performed following the standard protocol during bolus
administration of intravenous contrast.
Contrast: 100 ml Amnipaque-B88 IV

[Series 2: c/a/p 5.0 b31f · axial · 0.73mm/px · z∈[-750,-260]mm · 13 of 112 slices shown, 15 images]
[im 7/112  soft-tissue]
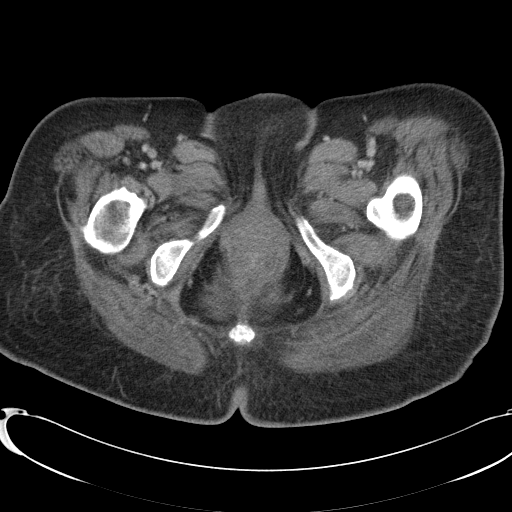
[im 7/112  bone]
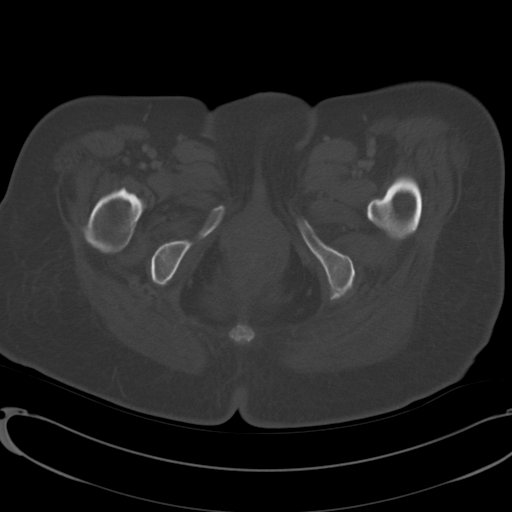
[im 14/112  soft-tissue]
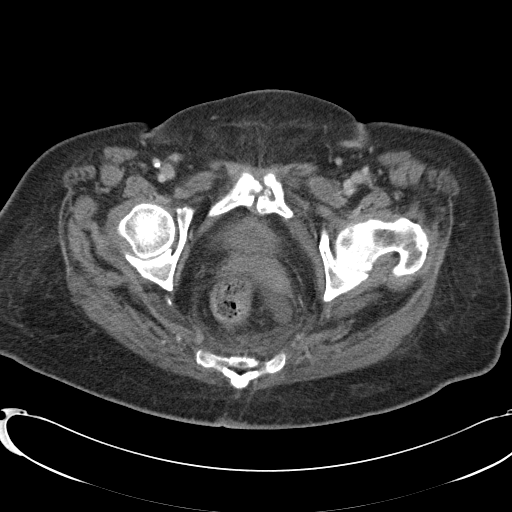
[im 27/112  soft-tissue]
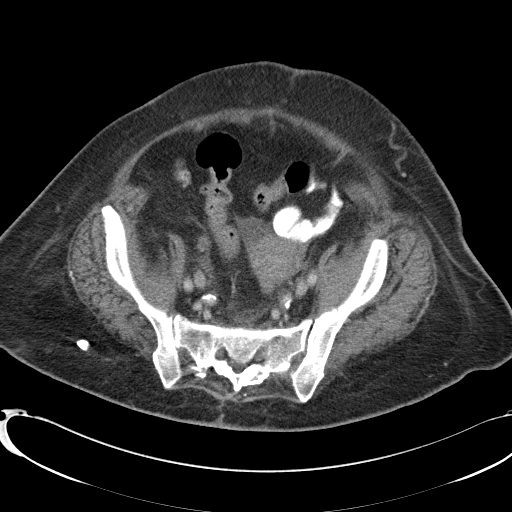
[im 33/112  soft-tissue]
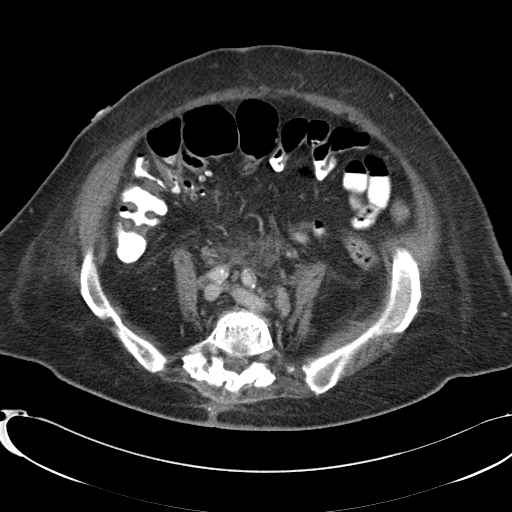
[im 40/112  soft-tissue]
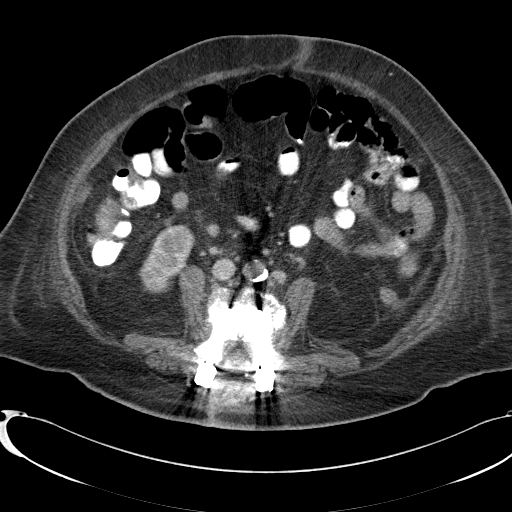
[im 46/112  soft-tissue]
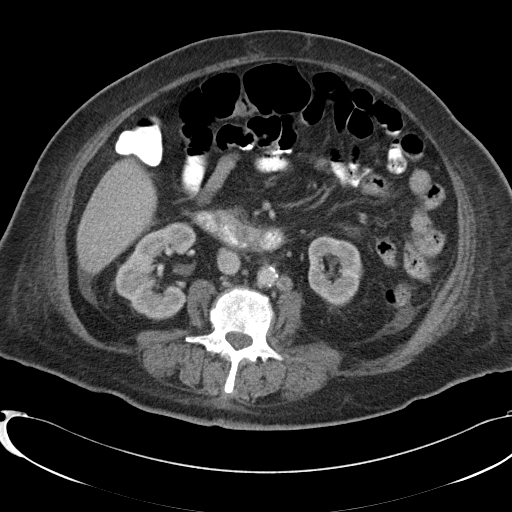
[im 59/112  soft-tissue]
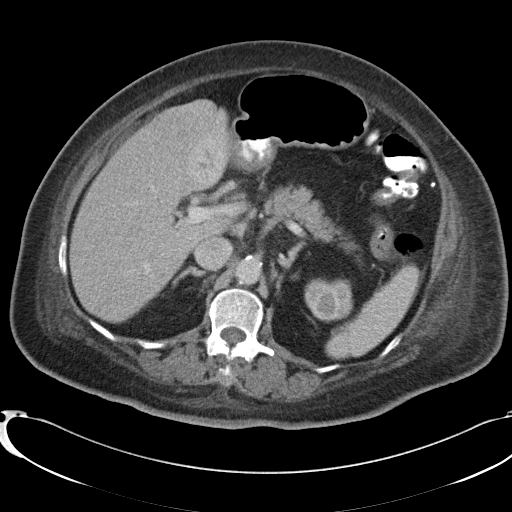
[im 66/112  soft-tissue]
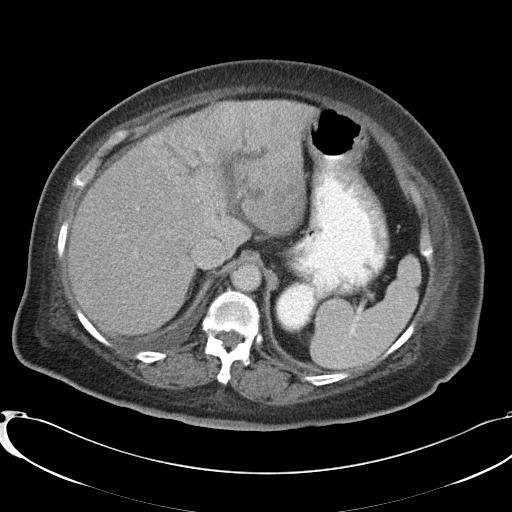
[im 72/112  soft-tissue]
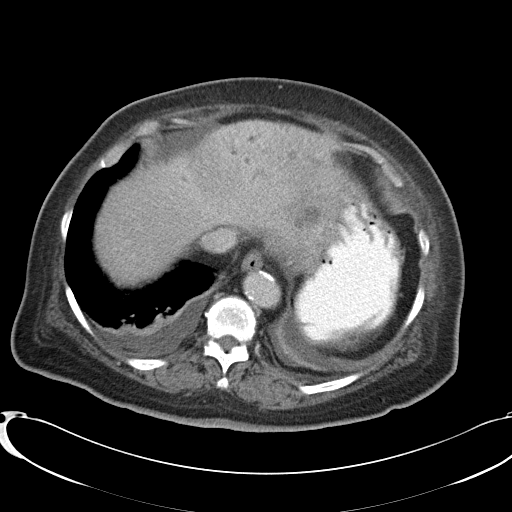
[im 72/112  bone]
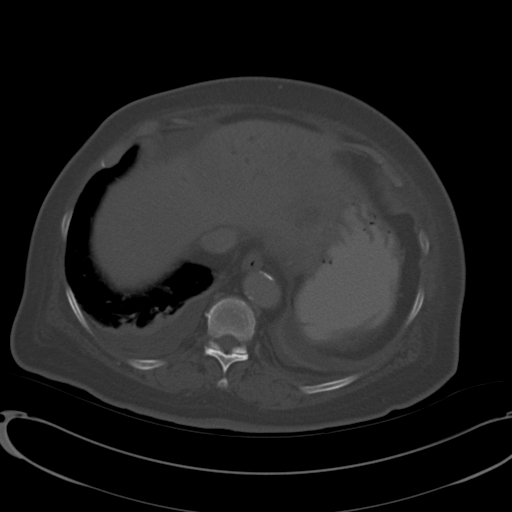
[im 79/112  soft-tissue]
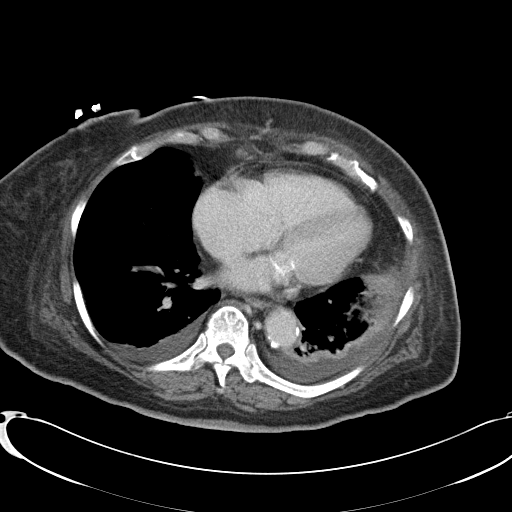
[im 85/112  soft-tissue]
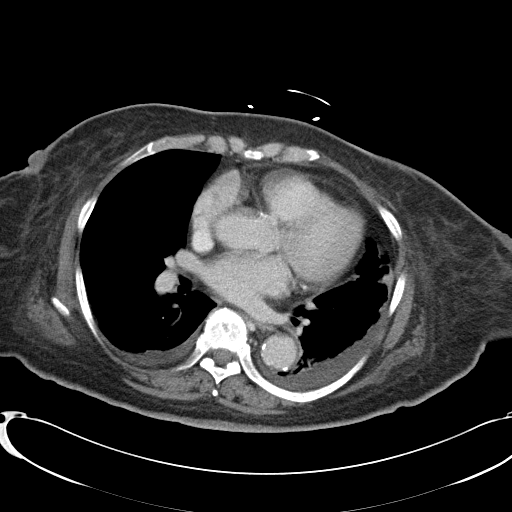
[im 98/112  soft-tissue]
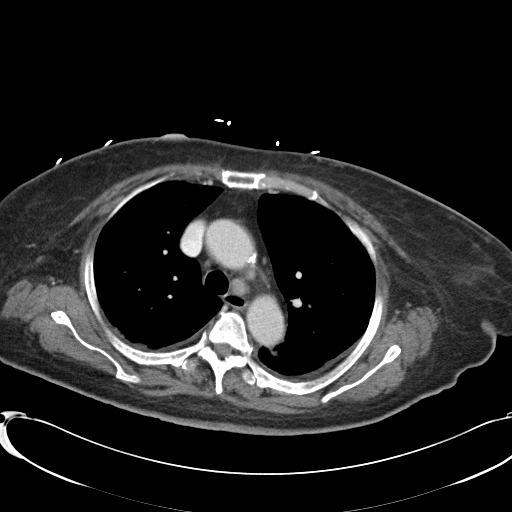
[im 105/112  soft-tissue]
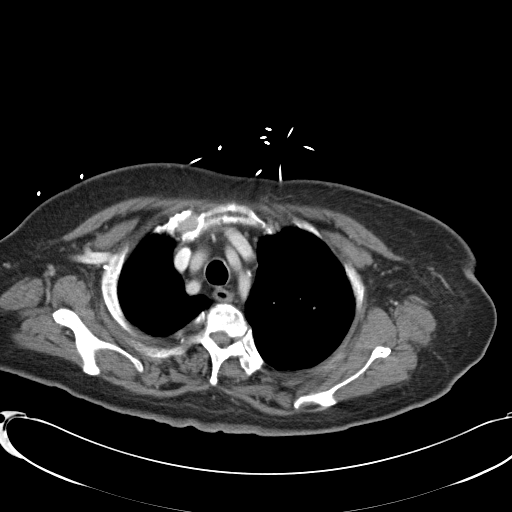

[Series 602: <mpr range> · coronal · 1.09mm/px · 3 of 125 slices shown]
[im 42/125  soft-tissue]
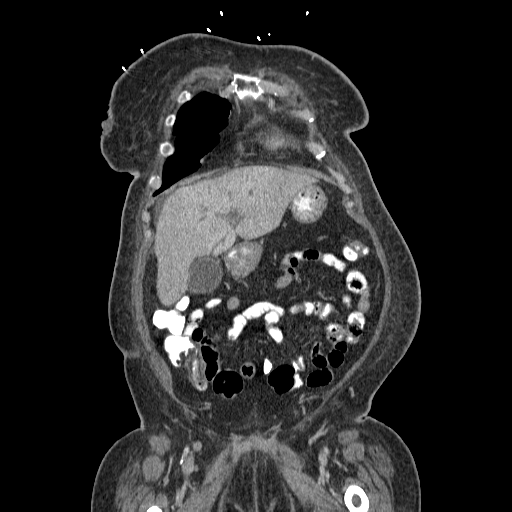
[im 56/125  soft-tissue]
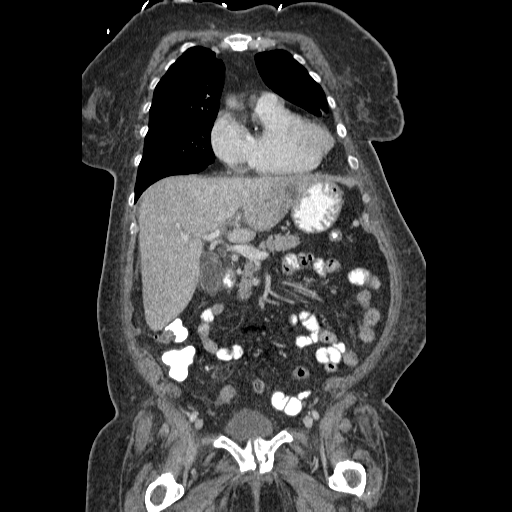
[im 69/125  soft-tissue]
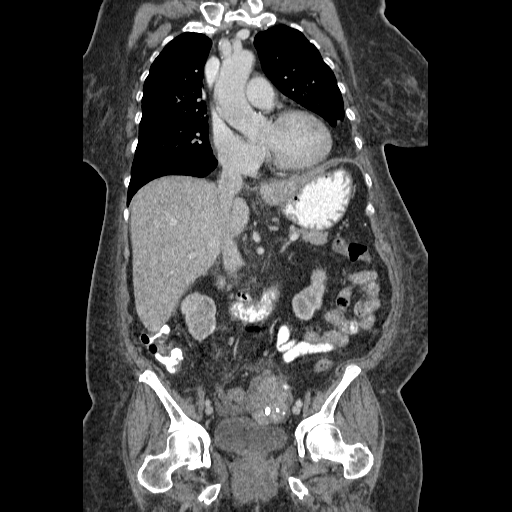

[16 of 46 positions shown; findings below may reference images not displayed]

FINDINGS: Small pleural effusions bilaterally.  Mild bibasilar
atelectasis.  No definite pneumonia.

Precarinal lymph node measures 14 mm.  No lung mass is present.  No
other adenopathy.  Mild coronary artery calcification.  Mild apical
emphysema bilaterally.
IMPRESSION: Small pleural effusions and mild bibasilar atelectasis.  Negative
for pneumonia

CT ABDOMEN AND PELVIS
FINDINGS: Left lobe liver is diffusely abnormal.  The left portal
vein is thrombosed.  There is edema throughout the left lobe of the
liver.  2 cm fluid collection in the lateral   left lobe of the
liver could represent a small abscess.  This could also represent
incidental cyst however given the portal vein thrombosis and
history, an abscess is possible.  Right lobe liver is normal.
Right portal vein is patent.  Gallbladder wall is mildly thickened
without evidence of acute cholecystitis.  Common bile duct is not
dilated.

Pancreas and spleen are normal.  Kidneys are normal without
obstruction or mass.  Small renal cyst on the left.

Negative for bowel obstruction.  No bowel thickening is present.

Calcified uterine fibroid is noted.  Small amount of free fluid in
the pelvis.  There is also a small amount of free fluid in the left
pericolic gutter.

Endplate destruction at L3-4 compatible with diskitis and
osteomyelitis.  Pedicle screw and interbody fusion at L4-5 appears
satisfactory.  Disc degeneration and spondylosis L5-S1.  No lumbar
fracture.

Extensive irregularity of the pubic symphysis with bony overgrowth
and irregularity which is well corticated.  This may be due to
chronic infection.  Acute infection and sterile arthropathy such as
CPPD are  other possibilities.
IMPRESSION: Thrombosis of the left portal vein with a possible 2 cm abscess in
the left lobe of the liver.  This may be due to septic
thrombophlebitis in the portal vein.

Diskitis and osteomyelitis L3-4.

Extensive abnormality pubic symphysis, possible chronic infection.

## 2012-05-14 ENCOUNTER — Other Ambulatory Visit: Payer: Self-pay | Admitting: Family Medicine

## 2012-05-26 ENCOUNTER — Other Ambulatory Visit: Payer: Self-pay | Admitting: Family Medicine

## 2012-05-26 NOTE — Telephone Encounter (Signed)
Refill request--- please advise    KP

## 2012-06-10 ENCOUNTER — Other Ambulatory Visit: Payer: Self-pay | Admitting: *Deleted

## 2012-06-10 DIAGNOSIS — E785 Hyperlipidemia, unspecified: Secondary | ICD-10-CM

## 2012-06-10 DIAGNOSIS — G629 Polyneuropathy, unspecified: Secondary | ICD-10-CM

## 2012-06-10 DIAGNOSIS — G8929 Other chronic pain: Secondary | ICD-10-CM

## 2012-06-10 NOTE — Telephone Encounter (Signed)
Pt called requesting that all of her meds except for losartan be changed to [facility].   Pt stated she needs a refill for flexeril 5mg . Take 1 tab TID prn #30 with 1 refill. Last filled 5.20.13. OK to refill?

## 2012-06-11 MED ORDER — GLIMEPIRIDE 2 MG PO TABS: 2.0000 mg | ORAL_TABLET | Freq: Every day | ORAL | Status: DC

## 2012-06-11 MED ORDER — METHOCARBAMOL 500 MG PO TABS: 500.0000 mg | ORAL_TABLET | Freq: Every day | ORAL | Status: DC | PRN

## 2012-06-11 MED ORDER — ATENOLOL 100 MG PO TABS: 100.0000 mg | ORAL_TABLET | Freq: Every day | ORAL | Status: DC

## 2012-06-11 MED ORDER — GABAPENTIN 100 MG PO CAPS: 100.0000 mg | ORAL_CAPSULE | Freq: Three times a day (TID) | ORAL | Status: DC

## 2012-06-11 MED ORDER — HYOSCYAMINE SULFATE 0.125 MG SL SUBL: 0.1250 mg | SUBLINGUAL_TABLET | SUBLINGUAL | Status: DC | PRN

## 2012-06-11 MED ORDER — OMEPRAZOLE 20 MG PO CPDR: 20.0000 mg | DELAYED_RELEASE_CAPSULE | Freq: Every day | ORAL | Status: DC

## 2012-06-11 MED ORDER — INDAPAMIDE 1.25 MG PO TABS: 1.2500 mg | ORAL_TABLET | Freq: Every day | ORAL | Status: DC

## 2012-06-11 MED ORDER — FENOFIBRATE 54 MG PO TABS: 54.0000 mg | ORAL_TABLET | Freq: Every day | ORAL | Status: DC

## 2012-06-11 MED ORDER — ATORVASTATIN CALCIUM 10 MG PO TABS: 10.0000 mg | ORAL_TABLET | Freq: Every day | ORAL | Status: DC

## 2012-06-11 MED ORDER — ALBUTEROL SULFATE HFA 108 (90 BASE) MCG/ACT IN AERS: 2.0000 | INHALATION_SPRAY | RESPIRATORY_TRACT | Status: DC | PRN

## 2012-06-11 MED ORDER — CYCLOBENZAPRINE HCL 5 MG PO TABS: 5.0000 mg | ORAL_TABLET | Freq: Three times a day (TID) | ORAL | Status: DC | PRN

## 2012-07-17 ENCOUNTER — Other Ambulatory Visit: Payer: Self-pay | Admitting: Family Medicine

## 2012-07-17 ENCOUNTER — Other Ambulatory Visit (INDEPENDENT_AMBULATORY_CARE_PROVIDER_SITE_OTHER): Payer: Medicare HMO

## 2012-07-17 DIAGNOSIS — E119 Type 2 diabetes mellitus without complications: Secondary | ICD-10-CM

## 2012-07-17 DIAGNOSIS — E785 Hyperlipidemia, unspecified: Secondary | ICD-10-CM

## 2012-07-17 LAB — HEPATIC FUNCTION PANEL
ALT: 15 U/L (ref 0–35)
Albumin: 3.8 g/dL (ref 3.5–5.2)
Alkaline Phosphatase: 47 U/L (ref 39–117)
Total Protein: 7.8 g/dL (ref 6.0–8.3)

## 2012-07-17 LAB — LIPID PANEL
Cholesterol: 245 mg/dL — ABNORMAL HIGH (ref 0–200)
VLDL: 25.6 mg/dL (ref 0.0–40.0)

## 2012-07-17 LAB — BASIC METABOLIC PANEL
BUN: 22 mg/dL (ref 6–23)
Calcium: 10.1 mg/dL (ref 8.4–10.5)
Creatinine, Ser: 1.4 mg/dL — ABNORMAL HIGH (ref 0.4–1.2)
GFR: 40.66 mL/min — ABNORMAL LOW (ref >60.00)
Potassium: 4.8 mEq/L (ref 3.5–5.1)

## 2012-07-17 LAB — MICROALBUMIN / CREATININE URINE RATIO: Microalb, Ur: 18.3 mg/dL — ABNORMAL HIGH (ref 0.0–1.9)

## 2012-07-17 LAB — LDL CHOLESTEROL, DIRECT: Direct LDL: 163.8 mg/dL

## 2012-07-17 NOTE — Progress Notes (Signed)
Orders entered per 03/04/12 lab result note.

## 2012-07-21 ENCOUNTER — Ambulatory Visit (INDEPENDENT_AMBULATORY_CARE_PROVIDER_SITE_OTHER): Payer: Medicare HMO | Admitting: Family Medicine

## 2012-07-21 ENCOUNTER — Telehealth: Payer: Self-pay | Admitting: Family Medicine

## 2012-07-21 ENCOUNTER — Encounter: Payer: Self-pay | Admitting: Family Medicine

## 2012-07-21 VITALS — BP 130/80 | HR 74 | Temp 98.3°F | Wt 142.4 lb

## 2012-07-21 DIAGNOSIS — I10 Essential (primary) hypertension: Secondary | ICD-10-CM

## 2012-07-21 DIAGNOSIS — G909 Disorder of the autonomic nervous system, unspecified: Secondary | ICD-10-CM

## 2012-07-21 DIAGNOSIS — E119 Type 2 diabetes mellitus without complications: Secondary | ICD-10-CM

## 2012-07-21 DIAGNOSIS — R55 Syncope and collapse: Secondary | ICD-10-CM | POA: Insufficient documentation

## 2012-07-21 DIAGNOSIS — R159 Full incontinence of feces: Secondary | ICD-10-CM

## 2012-07-21 DIAGNOSIS — Z23 Encounter for immunization: Secondary | ICD-10-CM

## 2012-07-21 DIAGNOSIS — E1149 Type 2 diabetes mellitus with other diabetic neurological complication: Secondary | ICD-10-CM

## 2012-07-21 DIAGNOSIS — R197 Diarrhea, unspecified: Secondary | ICD-10-CM

## 2012-07-21 DIAGNOSIS — K589 Irritable bowel syndrome without diarrhea: Secondary | ICD-10-CM

## 2012-07-21 DIAGNOSIS — E785 Hyperlipidemia, unspecified: Secondary | ICD-10-CM

## 2012-07-21 DIAGNOSIS — E1142 Type 2 diabetes mellitus with diabetic polyneuropathy: Secondary | ICD-10-CM

## 2012-07-21 MED ORDER — HYOSCYAMINE SULFATE 0.125 MG SL SUBL: 0.1250 mg | SUBLINGUAL_TABLET | SUBLINGUAL | Status: DC | PRN

## 2012-07-21 MED ORDER — ROSUVASTATIN CALCIUM 5 MG PO TABS: ORAL_TABLET | ORAL | Status: DC

## 2012-07-21 NOTE — Assessment & Plan Note (Signed)
Started again since stopping levsin Check labs If no better refer back GI

## 2012-07-21 NOTE — Assessment & Plan Note (Signed)
lipitor causing myalgias Change to crestor 5 mg 3x a week Recheck labs 3 months Labs reviewed

## 2012-07-21 NOTE — Assessment & Plan Note (Signed)
Great control!  con't meds ,  Labs reviewed

## 2012-07-21 NOTE — Assessment & Plan Note (Signed)
Pt states home health nurse requested she have her carotids checked

## 2012-07-21 NOTE — Progress Notes (Signed)
  Subjective:    Patient ID: [name], female    DOB: 1935/08/26, 77 y.o.   MRN: [id number]  HPI Pt here for b/u but had multiple complaints.   She stopped the lipitor because of arm pain and then pain resolved.  She stopped her levsin and has had diarrhea since.   No fever, no abd pain, no NV.  She is also here f/u DM-- her sugars have been mostly < 100.   Review of Systems    as above Objective:   Physical Exam  Constitutional: She is oriented to person, place, and time. She appears well-developed and well-nourished.  Eyes: Conjunctivae normal are normal. Pupils are equal, round, and reactive to light.  Neck: Normal range of motion. Neck supple.  Cardiovascular: Normal rate and regular rhythm.   Pulmonary/Chest: Effort normal and breath sounds normal. No respiratory distress. She has no wheezes. She has no rales.  Abdominal: Soft. She exhibits no distension.  Musculoskeletal: She exhibits no edema and no tenderness.       Sensory exam of the foot is normal, tested with the monofilament. Good pulses, no lesions or ulcers, good peripheral pulses.  Neurological: She is alert and oriented to person, place, and time.  Skin: No rash noted.  Psychiatric: She has a normal mood and affect. Her behavior is normal. Judgment and thought content normal.          Assessment & Plan:

## 2012-07-21 NOTE — Patient Instructions (Addendum)

## 2012-07-21 NOTE — Telephone Encounter (Signed)
The patient can go to [facility] at anytime the future orders will be put in the system.      KP

## 2012-07-21 NOTE — Assessment & Plan Note (Signed)
con't meds stable 

## 2012-07-21 NOTE — Telephone Encounter (Signed)
Pt is scheduled for a 6 month followup on 01/20/13. She stated that she needed labs one week before at [facility]. They would like someone to call to let them know when it will be. I cannot schedule at [facility] sorry.

## 2012-07-22 ENCOUNTER — Ambulatory Visit: Payer: Medicare HMO | Admitting: Family Medicine

## 2012-07-24 ENCOUNTER — Other Ambulatory Visit: Payer: Self-pay | Admitting: *Deleted

## 2012-07-24 DIAGNOSIS — R55 Syncope and collapse: Secondary | ICD-10-CM

## 2012-08-03 ENCOUNTER — Encounter: Payer: Self-pay | Admitting: Gastroenterology

## 2012-08-06 ENCOUNTER — Encounter: Payer: Self-pay | Admitting: Family Medicine

## 2012-08-27 ENCOUNTER — Other Ambulatory Visit: Payer: Self-pay | Admitting: Family Medicine

## 2012-09-11 ENCOUNTER — Other Ambulatory Visit: Payer: Self-pay | Admitting: Family Medicine

## 2012-09-11 NOTE — Telephone Encounter (Signed)
Rx sent.    MW 

## 2012-10-05 ENCOUNTER — Other Ambulatory Visit: Payer: Self-pay | Admitting: Internal Medicine

## 2012-10-05 ENCOUNTER — Other Ambulatory Visit: Payer: Self-pay | Admitting: Family Medicine

## 2012-12-10 ENCOUNTER — Telehealth: Payer: Self-pay | Admitting: Family Medicine

## 2012-12-10 ENCOUNTER — Other Ambulatory Visit: Payer: Self-pay | Admitting: Family Medicine

## 2012-12-10 MED ORDER — CLOTRIMAZOLE-BETAMETHASONE 1-0.05 % EX CREA: TOPICAL_CREAM | Freq: Two times a day (BID) | CUTANEOUS | Status: DC

## 2012-12-10 NOTE — Telephone Encounter (Signed)
Patient would like rx for clotrimazole-betamethasone (LOTRISONE) cream sent to [facility].

## 2012-12-10 NOTE — Telephone Encounter (Signed)
Ok to refill with 3 refills.  

## 2012-12-10 NOTE — Telephone Encounter (Signed)
Last seen 07/21/12 and filled 05/26/12 # 30g with 1 refill. Please advise     KP

## 2012-12-21 ENCOUNTER — Other Ambulatory Visit: Payer: Self-pay | Admitting: Family Medicine

## 2013-01-12 ENCOUNTER — Telehealth: Payer: Self-pay | Admitting: Family Medicine

## 2013-01-12 NOTE — Telephone Encounter (Signed)
Patient aware Future orders are in the system      KP

## 2013-01-12 NOTE — Telephone Encounter (Signed)
Patient states that she has an appointment on 02/03/13. She says that she wants to go to the [facility] lab for labs prior to this appointment

## 2013-01-20 ENCOUNTER — Ambulatory Visit: Payer: Medicare HMO | Admitting: Family Medicine

## 2013-01-29 ENCOUNTER — Ambulatory Visit (INDEPENDENT_AMBULATORY_CARE_PROVIDER_SITE_OTHER): Payer: Medicare HMO

## 2013-01-29 ENCOUNTER — Other Ambulatory Visit: Payer: Medicare HMO

## 2013-01-29 DIAGNOSIS — E785 Hyperlipidemia, unspecified: Secondary | ICD-10-CM

## 2013-01-29 DIAGNOSIS — E119 Type 2 diabetes mellitus without complications: Secondary | ICD-10-CM

## 2013-02-01 ENCOUNTER — Other Ambulatory Visit: Payer: Self-pay

## 2013-02-01 DIAGNOSIS — E119 Type 2 diabetes mellitus without complications: Secondary | ICD-10-CM

## 2013-02-01 DIAGNOSIS — E785 Hyperlipidemia, unspecified: Secondary | ICD-10-CM

## 2013-02-01 LAB — HEPATIC FUNCTION PANEL
Albumin: 3.8 g/dL (ref 3.5–5.2)
Alkaline Phosphatase: 52 U/L (ref 39–117)
Bilirubin, Direct: 0.4 mg/dL — ABNORMAL HIGH (ref 0.0–0.3)

## 2013-02-01 LAB — LIPID PANEL
Cholesterol: 266 mg/dL — ABNORMAL HIGH (ref 0–200)
Total CHOL/HDL Ratio: 5
VLDL: 33 mg/dL (ref 0.0–40.0)

## 2013-02-01 LAB — MICROALBUMIN / CREATININE URINE RATIO
Microalb Creat Ratio: 113.4 mg/g — ABNORMAL HIGH (ref 0.0–30.0)
Microalb, Ur: 23.6 mg/dL — ABNORMAL HIGH (ref 0.0–1.9)

## 2013-02-01 LAB — BASIC METABOLIC PANEL
BUN: 33 mg/dL — ABNORMAL HIGH (ref 6–23)
Chloride: 102 mEq/L (ref 96–112)
Potassium: 3.9 mEq/L (ref 3.5–5.1)
Sodium: 137 mEq/L (ref 135–145)

## 2013-02-01 LAB — LDL CHOLESTEROL, DIRECT: Direct LDL: 178.8 mg/dL

## 2013-02-02 ENCOUNTER — Encounter: Payer: Self-pay | Admitting: Lab

## 2013-02-02 ENCOUNTER — Encounter: Payer: Self-pay | Admitting: Family Medicine

## 2013-02-03 ENCOUNTER — Encounter: Payer: Self-pay | Admitting: Family Medicine

## 2013-02-03 ENCOUNTER — Ambulatory Visit (INDEPENDENT_AMBULATORY_CARE_PROVIDER_SITE_OTHER): Payer: Medicare HMO | Admitting: Family Medicine

## 2013-02-03 VITALS — BP 134/72 | HR 71 | Temp 98.8°F | Wt 149.6 lb

## 2013-02-03 DIAGNOSIS — I10 Essential (primary) hypertension: Secondary | ICD-10-CM

## 2013-02-03 DIAGNOSIS — E1142 Type 2 diabetes mellitus with diabetic polyneuropathy: Secondary | ICD-10-CM

## 2013-02-03 DIAGNOSIS — E785 Hyperlipidemia, unspecified: Secondary | ICD-10-CM

## 2013-02-03 DIAGNOSIS — E1149 Type 2 diabetes mellitus with other diabetic neurological complication: Secondary | ICD-10-CM

## 2013-02-03 DIAGNOSIS — R809 Proteinuria, unspecified: Secondary | ICD-10-CM

## 2013-02-03 DIAGNOSIS — J45909 Unspecified asthma, uncomplicated: Secondary | ICD-10-CM

## 2013-02-03 DIAGNOSIS — R159 Full incontinence of feces: Secondary | ICD-10-CM

## 2013-02-03 DIAGNOSIS — E119 Type 2 diabetes mellitus without complications: Secondary | ICD-10-CM

## 2013-02-03 DIAGNOSIS — G589 Mononeuropathy, unspecified: Secondary | ICD-10-CM

## 2013-02-03 DIAGNOSIS — N189 Chronic kidney disease, unspecified: Secondary | ICD-10-CM

## 2013-02-03 DIAGNOSIS — G629 Polyneuropathy, unspecified: Secondary | ICD-10-CM

## 2013-02-03 MED ORDER — FLUTICASONE-SALMETEROL 250-50 MCG/DOSE IN AEPB: 1.0000 | INHALATION_SPRAY | Freq: Two times a day (BID) | RESPIRATORY_TRACT | Status: DC

## 2013-02-03 MED ORDER — ROSUVASTATIN CALCIUM 5 MG PO TABS: ORAL_TABLET | ORAL | Status: DC

## 2013-02-03 NOTE — Patient Instructions (Signed)

## 2013-02-03 NOTE — Assessment & Plan Note (Addendum)
Labs reviewed with pt con't meds

## 2013-02-03 NOTE — Assessment & Plan Note (Signed)
con't gabepentin 

## 2013-02-03 NOTE — Progress Notes (Signed)
  Subjective:    Patient ID: Ishanvi Mcquitty, female    DOB: August 10, 1935, 78 y.o.   MRN: [id number]  HPI HYPERTENSION Disease Monitoring Blood pressure range-not checked Chest pain- no      Dyspnea- no Medications Compliance- good Lightheadedness- no   Edema- no   DIABETES Disease Monitoring Blood Sugar ranges-79-138-- checking 2-3 x a day Polyuria- no New Visual problems- no Medications Compliance- good Hypoglycemic symptoms- no   HYPERLIPIDEMIA Disease Monitoring See symptoms for Hypertension Medications Compliance- good RUQ pain- no  Muscle aches- nothing new  ROS See HPI above   PMH Smoking Status noted     Review of Systems As above     Objective:   Physical Exam  BP 134/72  Pulse 71  Temp(Src) 98.8 F (37.1 C) (Oral)  Wt 149 lb 9.6 oz (67.858 kg)  BMI 31.27 kg/m2  SpO2 95% General appearance: alert, cooperative, appears stated age and no distress Ears: normal TM's and external ear canals both ears Nose: Nares normal. Septum midline. Mucosa normal. No drainage or sinus tenderness. Throat: lips, mucosa, and tongue normal; teeth and gums normal Neck: no adenopathy, supple, symmetrical, trachea midline and thyroid not enlarged, symmetric, no tenderness/mass/nodules Lungs: clear to auscultation bilaterally Heart: S1, S2 normal Extremities: extremities normal, atraumatic, no cyanosis or edema Sensory exam of the foot is normal, tested with the monofilament. Good pulses, no lesions or ulcers, good peripheral pulses.       Assessment & Plan:

## 2013-02-03 NOTE — Assessment & Plan Note (Addendum)
Labs reviewed Pt ran out of crestor--- recheck labs 3 months

## 2013-02-03 NOTE — Assessment & Plan Note (Signed)
Labs reviewed.

## 2013-02-06 ENCOUNTER — Other Ambulatory Visit: Payer: Self-pay | Admitting: Family Medicine

## 2013-02-08 NOTE — Telephone Encounter (Signed)
Last seen 02/03/13 and filled 06/10/12 #30 with 1 refill. Please advise    KP

## 2013-02-12 ENCOUNTER — Encounter: Payer: Self-pay | Admitting: Family Medicine

## 2013-02-19 ENCOUNTER — Encounter: Payer: Self-pay | Admitting: Lab

## 2013-02-22 ENCOUNTER — Encounter: Payer: Self-pay | Admitting: Family Medicine

## 2013-02-22 ENCOUNTER — Ambulatory Visit (INDEPENDENT_AMBULATORY_CARE_PROVIDER_SITE_OTHER): Payer: Medicare HMO | Admitting: Family Medicine

## 2013-02-22 VITALS — BP 151/61 | HR 70 | Temp 98.2°F | Wt 150.2 lb

## 2013-02-22 DIAGNOSIS — M25561 Pain in right knee: Secondary | ICD-10-CM

## 2013-02-22 DIAGNOSIS — R413 Other amnesia: Secondary | ICD-10-CM

## 2013-02-22 DIAGNOSIS — M25569 Pain in unspecified knee: Secondary | ICD-10-CM

## 2013-02-22 NOTE — Assessment & Plan Note (Signed)
Based on 1 incident Family is not concerned at this point and neither am I Check tsh and b2 rto prn

## 2013-02-22 NOTE — Progress Notes (Signed)
  Subjective:    [name] is a 78 y.o. female who presents with knee pain involving the right knee. Onset was sudden, not related to any specific activity. Inciting event: none known. Current symptoms include: pain located across front and behind knee, no calf pain. Pain is aggravated by any weight bearing, going up and down stairs, rising after sitting and walking. Patient has had prior knee problems. Evaluation to date: none. Treatment to date: none. Pt also c/o memory loss. She misplaced her debit card a few months ago and the entire family was looking for it and she had left it in her pouch attached to her walker.  Family is not concerned at all.  Son in law is present.   The following portions of the patient's history were reviewed and updated as appropriate: allergies, current medications, past family history, past medical history, past social history, past surgical history and problem list.   Review of Systems Pertinent items are noted in HPI.   Objective:    BP 151/61  Pulse 70  Temp(Src) 98.2 F (36.8 C) (Oral)  Wt 150 lb 3.2 oz (68.13 kg)  BMI 31.4 kg/m2  SpO2 99% Right knee: positive exam findings: effusion and pain only with weight bearing and negative exam findings: no erythema and no crepitus  Left knee:  normal and no effusion, full active range of motion, no joint line tenderness, ligamentous structures intact.   neuro--MMSE 29/30 X-ray right knee: not available    Assessment:    Right knee pain    Plan:    Natural history and expected course discussed. Questions answered. Transport planner distributed. Rest, ice, compression, and elevation (RICE) therapy. Reduction in offending activity. Patellar compression sleeve. Plain film x-rays.

## 2013-02-22 NOTE — Patient Instructions (Signed)
Knee Pain The knee is the complex joint between your thigh and your lower leg. It is made up of bones, tendons, ligaments, and cartilage. The bones that make up the knee are:  The femur in the thigh.  The tibia and fibula in the lower leg.  The patella or kneecap riding in the groove on the lower femur. CAUSES  Knee pain is a common complaint with many causes. A few of these causes are:  Injury, such as:  A ruptured ligament or tendon injury.  Torn cartilage.  Medical conditions, such as:  Gout  Arthritis  Infections  Overuse, over training or overdoing a physical activity. Knee pain can be minor or severe. Knee pain can accompany debilitating injury. Minor knee problems often respond well to self-care measures or get well on their own. More serious injuries may need medical intervention or even surgery. SYMPTOMS The knee is complex. Symptoms of knee problems can vary widely. Some of the problems are:  Pain with movement and weight bearing.  Swelling and tenderness.  Buckling of the knee.  Inability to straighten or extend your knee.  Your knee locks and you cannot straighten it.  Warmth and redness with pain and fever.  Deformity or dislocation of the kneecap. DIAGNOSIS  Determining what is wrong may be very straight forward such as when there is an injury. It can also be challenging because of the complexity of the knee. Tests to make a diagnosis may include:  Your caregiver taking a history and doing a physical exam.  Routine X-rays can be used to rule out other problems. X-rays will not reveal a cartilage tear. Some injuries of the knee can be diagnosed by:  Arthroscopy a surgical technique by which a small video camera is inserted through tiny incisions on the sides of the knee. This procedure is used to examine and repair internal knee joint problems. Tiny instruments can be used during arthroscopy to repair the torn knee cartilage (meniscus).  Arthrography  is a radiology technique. A contrast liquid is directly injected into the knee joint. Internal structures of the knee joint then become visible on X-ray film.  An MRI scan is a non x-ray radiology procedure in which magnetic fields and a computer produce two- or three-dimensional images of the inside of the knee. Cartilage tears are often visible using an MRI scanner. MRI scans have largely replaced arthrography in diagnosing cartilage tears of the knee.  Blood work.  Examination of the fluid that helps to lubricate the knee joint (synovial fluid). This is done by taking a sample out using a needle and a syringe. TREATMENT The treatment of knee problems depends on the cause. Some of these treatments are:  Depending on the injury, proper casting, splinting, surgery or physical therapy care will be needed.  Give yourself adequate recovery time. Do not overuse your joints. If you begin to get sore during workout routines, back off. Slow down or do fewer repetitions.  For repetitive activities such as cycling or running, maintain your strength and nutrition.  Alternate muscle groups. For example if you are a weight lifter, work the upper body on one day and the lower body the next.  Either tight or weak muscles do not give the proper support for your knee. Tight or weak muscles do not absorb the stress placed on the knee joint. Keep the muscles surrounding the knee strong.  Take care of mechanical problems.  If you have flat feet, orthotics or special shoes may help.   See your caregiver if you need help.  Arch supports, sometimes with wedges on the inner or outer aspect of the heel, can help. These can shift pressure away from the side of the knee most bothered by osteoarthritis.  A brace called an "unloader" brace also may be used to help ease the pressure on the most arthritic side of the knee.  If your caregiver has prescribed crutches, braces, wraps or ice, use as directed. The acronym for  this is PRICE. This means protection, rest, ice, compression and elevation.  Nonsteroidal anti-inflammatory drugs (NSAID's), can help relieve pain. But if taken immediately after an injury, they may actually increase swelling. Take NSAID's with food in your stomach. Stop them if you develop stomach problems. Do not take these if you have a history of ulcers, stomach pain or bleeding from the bowel. Do not take without your caregiver's approval if you have problems with fluid retention, heart failure, or kidney problems.  For ongoing knee problems, physical therapy may be helpful.  Glucosamine and chondroitin are over-the-counter dietary supplements. Both may help relieve the pain of osteoarthritis in the knee. These medicines are different from the usual anti-inflammatory drugs. Glucosamine may decrease the rate of cartilage destruction.  Injections of a corticosteroid drug into your knee joint may help reduce the symptoms of an arthritis flare-up. They may provide pain relief that lasts a few months. You may have to wait a few months between injections. The injections do have a small increased risk of infection, water retention and elevated blood sugar levels.  Hyaluronic acid injected into damaged joints may ease pain and provide lubrication. These injections may work by reducing inflammation. A series of shots may give relief for as long as 6 months.  Topical painkillers. Applying certain ointments to your skin may help relieve the pain and stiffness of osteoarthritis. Ask your pharmacist for suggestions. Many over the-counter products are approved for temporary relief of arthritis pain.  In some countries, doctors often prescribe topical NSAID's for relief of chronic conditions such as arthritis and tendinitis. A review of treatment with NSAID creams found that they worked as well as oral medications but without the serious side effects. PREVENTION  Maintain a healthy weight. Extra pounds put  more strain on your joints.  Get strong, stay limber. Weak muscles are a common cause of knee injuries. Stretching is important. Include flexibility exercises in your workouts.  Be smart about exercise. If you have osteoarthritis, chronic knee pain or recurring injuries, you may need to change the way you exercise. This does not mean you have to stop being active. If your knees ache after jogging or playing basketball, consider switching to swimming, water aerobics or other low-impact activities, at least for a few days a week. Sometimes limiting high-impact activities will provide relief.  Make sure your shoes fit well. Choose footwear that is right for your sport.  Protect your knees. Use the proper gear for knee-sensitive activities. Use kneepads when playing volleyball or laying carpet. Buckle your seat belt every time you drive. Most shattered kneecaps occur in car accidents.  Rest when you are tired. SEEK MEDICAL CARE IF:  You have knee pain that is continual and does not seem to be getting better.  SEEK IMMEDIATE MEDICAL CARE IF:  Your knee joint feels hot to the touch and you have a high fever. MAKE SURE YOU:   Understand these instructions.  Will watch your condition.  Will get help right away if you are not   doing well or get worse. Document Released: 08/18/2007 Document Revised: 01/13/2012 Document Reviewed: 08/18/2007 [employer] Patient Information 2013 [employer].  

## 2013-03-08 ENCOUNTER — Other Ambulatory Visit: Payer: Self-pay | Admitting: Family Medicine

## 2013-03-10 ENCOUNTER — Other Ambulatory Visit: Payer: Self-pay | Admitting: Family Medicine

## 2013-03-16 ENCOUNTER — Other Ambulatory Visit: Payer: Self-pay | Admitting: Family Medicine

## 2013-03-19 ENCOUNTER — Telehealth: Payer: Self-pay | Admitting: Family Medicine

## 2013-03-19 NOTE — Telephone Encounter (Signed)
To MD for review     KP 

## 2013-03-19 NOTE — Telephone Encounter (Signed)
In reference to Gastroenterology referral entered 02/03/13, [facility] has attempted to reach patient, and left message with her family for a return call.  I have spoke with patient, her family, and mailed a letter, patient will not schedule her appointment.  Also, this may end up being the case with the Nephrology referral entered the same date.

## 2013-03-21 NOTE — Telephone Encounter (Signed)
noted 

## 2013-04-11 ENCOUNTER — Other Ambulatory Visit: Payer: Self-pay | Admitting: Family Medicine

## 2013-06-09 ENCOUNTER — Other Ambulatory Visit: Payer: Self-pay

## 2013-06-24 ENCOUNTER — Other Ambulatory Visit: Payer: Self-pay | Admitting: Family Medicine

## 2013-06-24 ENCOUNTER — Telehealth: Payer: Self-pay

## 2013-06-24 DIAGNOSIS — E1149 Type 2 diabetes mellitus with other diabetic neurological complication: Secondary | ICD-10-CM

## 2013-06-24 DIAGNOSIS — E119 Type 2 diabetes mellitus without complications: Secondary | ICD-10-CM

## 2013-06-24 DIAGNOSIS — E785 Hyperlipidemia, unspecified: Secondary | ICD-10-CM

## 2013-06-24 DIAGNOSIS — I10 Essential (primary) hypertension: Secondary | ICD-10-CM

## 2013-06-24 NOTE — Telephone Encounter (Signed)
Message copied by Arnette Norris on Thu Jun 24, 2013  8:26 AM ------      Message from: Arvilla Meres P      Created: Wed Jun 23, 2013  1:08 PM       Patient has appt 08/24/13 and would like to have labs done at [facility] a week prior to this. There are several orders already entered. Can you figure out what she needs for me? Please and thank you. ------

## 2013-06-24 NOTE — Telephone Encounter (Signed)
Please advise which labs you want done.        KP

## 2013-06-24 NOTE — Telephone Encounter (Signed)
Order put in.

## 2013-07-07 ENCOUNTER — Other Ambulatory Visit: Payer: Self-pay | Admitting: Family Medicine

## 2013-07-08 ENCOUNTER — Other Ambulatory Visit: Payer: Self-pay | Admitting: Family Medicine

## 2013-07-08 DIAGNOSIS — M791 Myalgia, unspecified site: Secondary | ICD-10-CM

## 2013-07-08 NOTE — Telephone Encounter (Signed)
rx refilled per protocol. DJR  

## 2013-08-05 ENCOUNTER — Other Ambulatory Visit: Payer: Self-pay | Admitting: Family Medicine

## 2013-08-05 NOTE — Telephone Encounter (Signed)
Last seen 02/22/13 and both filled 07/08/13 #30. Please advise     KP

## 2013-08-20 ENCOUNTER — Other Ambulatory Visit (INDEPENDENT_AMBULATORY_CARE_PROVIDER_SITE_OTHER): Payer: Medicare HMO

## 2013-08-20 DIAGNOSIS — E119 Type 2 diabetes mellitus without complications: Secondary | ICD-10-CM

## 2013-08-20 DIAGNOSIS — E785 Hyperlipidemia, unspecified: Secondary | ICD-10-CM

## 2013-08-20 LAB — BASIC METABOLIC PANEL
BUN: 43 mg/dL — ABNORMAL HIGH (ref 6–23)
Calcium: 9.4 mg/dL (ref 8.4–10.5)
Creatinine, Ser: 1.9 mg/dL — ABNORMAL HIGH (ref 0.4–1.2)

## 2013-08-20 LAB — LIPID PANEL: Cholesterol: 391 mg/dL — ABNORMAL HIGH (ref 0–200)

## 2013-08-20 LAB — MICROALBUMIN / CREATININE URINE RATIO
Creatinine,U: 30.5 mg/dL
Microalb Creat Ratio: 315.3 mg/g — ABNORMAL HIGH (ref 0.0–30.0)

## 2013-08-20 LAB — HEPATIC FUNCTION PANEL
AST: 27 U/L (ref 0–37)
Albumin: 3.6 g/dL (ref 3.5–5.2)
Alkaline Phosphatase: 85 U/L (ref 39–117)
Bilirubin, Direct: 0.1 mg/dL (ref 0.0–0.3)
Total Protein: 7.2 g/dL (ref 6.0–8.3)

## 2013-08-20 LAB — HEMOGLOBIN A1C: Hgb A1c MFr Bld: 7 % — ABNORMAL HIGH (ref 4.6–6.5)

## 2013-08-24 ENCOUNTER — Ambulatory Visit: Payer: Medicare HMO | Admitting: Family Medicine

## 2013-08-27 ENCOUNTER — Ambulatory Visit (INDEPENDENT_AMBULATORY_CARE_PROVIDER_SITE_OTHER): Payer: Medicare HMO | Admitting: Family Medicine

## 2013-08-27 ENCOUNTER — Telehealth: Payer: Self-pay | Admitting: Family Medicine

## 2013-08-27 ENCOUNTER — Encounter: Payer: Self-pay | Admitting: Family Medicine

## 2013-08-27 VITALS — BP 138/70 | HR 64 | Temp 98.2°F | Wt 152.0 lb

## 2013-08-27 DIAGNOSIS — M549 Dorsalgia, unspecified: Secondary | ICD-10-CM

## 2013-08-27 DIAGNOSIS — M810 Age-related osteoporosis without current pathological fracture: Secondary | ICD-10-CM

## 2013-08-27 DIAGNOSIS — E1149 Type 2 diabetes mellitus with other diabetic neurological complication: Secondary | ICD-10-CM

## 2013-08-27 DIAGNOSIS — E1142 Type 2 diabetes mellitus with diabetic polyneuropathy: Secondary | ICD-10-CM

## 2013-08-27 DIAGNOSIS — J45909 Unspecified asthma, uncomplicated: Secondary | ICD-10-CM

## 2013-08-27 DIAGNOSIS — E785 Hyperlipidemia, unspecified: Secondary | ICD-10-CM

## 2013-08-27 DIAGNOSIS — E119 Type 2 diabetes mellitus without complications: Secondary | ICD-10-CM

## 2013-08-27 MED ORDER — TRAMADOL HCL 50 MG PO TABS: 50.0000 mg | ORAL_TABLET | Freq: Three times a day (TID) | ORAL | Status: DC | PRN

## 2013-08-27 MED ORDER — FLUTICASONE-SALMETEROL 250-50 MCG/DOSE IN AEPB: 1.0000 | INHALATION_SPRAY | Freq: Two times a day (BID) | RESPIRATORY_TRACT | Status: DC

## 2013-08-27 MED ORDER — ROSUVASTATIN CALCIUM 5 MG PO TABS: ORAL_TABLET | ORAL | Status: DC

## 2013-08-27 NOTE — Telephone Encounter (Signed)
Patient wants to have her labs drawn in 3 months before she sees Dr. Laury Axon on 12/02/13. She wants labs done at [facility]. Will need orders rto 3 months--- recheck labs 272.4  250.00   Lipid, hep, bmp, hgba1c

## 2013-08-27 NOTE — Patient Instructions (Addendum)
  rto 3 months--- recheck labs 272.4  250.00   Lipid, hep, bmp, hgba1c  Lipid Blood Tests Blood lipids are fats found in the circulation. Cholesterol and triglycerides are the two main lipids measured for determining your risk of getting heart and blood vessel diseases. The cholesterol in the blood is attached to two different molecules called lipoproteins. HDL is the beneficial high density lipoprotein cholesterol which reduces coronary risk. Low density lipoprotein cholesterol, the LDL, carries an increased risk for heart and vascular disease when it is high. Most of the body's fat tissue is in the form of triglycerides. Medical conditions that elevate the blood triglyceride level include diabetes, thyroid, kidney, and liver diseases.  A lipid panel usually includes the total cholesterol, LDL, and HDL blood levels. For best results, you should fast overnight before the blood tests are drawn. The following risk factors are generally accepted for different lipids:  LDL - Below 100 means low risk, 130-160 borderline risk, 160-190 high risk, above 190 is considered very high risk.  HDL - Below 40 means high risk, 40-60 average risk, 60 and higher means a reduced risk for heart and blood vessel diseases.  Total cholesterol - Below 200 means low risk, 200-240 is borderline risk, above 240 is higher risk.  Triglycerides - Below 200 means low risk, 200-400 borderline risk, above 400 means increased risk. Many factors contribute to lipid levels including genetics, diet, exercise, body fat and medications. Reducing saturated fats in the diet and increasing fiber with fruits, vegetables and whole grains have been shown to improve blood lipids. Drugs may be prescribed to lower lipids if diet and exercise alone do not help bring the cholesterol levels under control. Document Released: 11/28/2004 Document Revised: 01/13/2012 Document Reviewed: 10/21/2005 [facility] Patient Information 2014 [place], [place].

## 2013-08-27 NOTE — Progress Notes (Signed)
  Subjective:    Patient ID: [name], female    DOB: 23-Oct-1935, 78 y.o.   MRN: [id number]  HPI HYPERTENSION Disease Monitoring Blood pressure range-good per home readings Chest pain- no      Dyspnea- no Medications Compliance- good Lightheadedness- no   Edema- yes   DIABETES Disease Monitoring Blood Sugar ranges-62-147 Polyuria- no New Visual problems- no Medications Compliance- good Hypoglycemic symptoms- no   HYPERLIPIDEMIA Disease Monitoring See symptoms for Hypertension Medications Compliance- poor RUQ pain- no  Muscle aches- yes but not new  ROS See HPI above   PMH Smoking Status noted     Review of Systems As above    Objective:   Physical Exam  BP 138/70  Pulse 64  Temp(Src) 98.2 F (36.8 C) (Oral)  Wt 152 lb (68.947 kg)  BMI 31.78 kg/m2  SpO2 98% General appearance: alert, cooperative, appears stated age and no distress Head: Normocephalic, without obvious abnormality, atraumatic Neck: no adenopathy, supple, symmetrical, trachea midline and thyroid not enlarged, symmetric, no tenderness/mass/nodules Lungs: clear to auscultation bilaterally Heart: S1, S2 normal Extremities: extremities normal, atraumatic, no cyanosis or edema Sensory exam of the foot is normal, tested with the monofilament. Good pulses, no lesions or ulcers, good peripheral pulses.       Assessment & Plan:

## 2013-08-28 ENCOUNTER — Encounter: Payer: Self-pay | Admitting: Family Medicine

## 2013-08-28 NOTE — Assessment & Plan Note (Signed)
Check labs Cont' meds 

## 2013-08-28 NOTE — Assessment & Plan Note (Signed)
Refill advair stable

## 2013-08-28 NOTE — Assessment & Plan Note (Addendum)
Cont meds Labs reviewed

## 2013-08-28 NOTE — Assessment & Plan Note (Signed)
Ultram prn

## 2013-10-04 ENCOUNTER — Other Ambulatory Visit: Payer: Self-pay | Admitting: Family Medicine

## 2013-10-05 NOTE — Telephone Encounter (Signed)
Last seen 08/27/13 and filled 08/05/13 #30 with 1 refill. Please advise        KP

## 2013-11-06 ENCOUNTER — Other Ambulatory Visit: Payer: Self-pay | Admitting: Family Medicine

## 2013-11-29 ENCOUNTER — Ambulatory Visit (INDEPENDENT_AMBULATORY_CARE_PROVIDER_SITE_OTHER): Payer: Medicare HMO

## 2013-11-29 DIAGNOSIS — E785 Hyperlipidemia, unspecified: Secondary | ICD-10-CM

## 2013-11-29 DIAGNOSIS — E119 Type 2 diabetes mellitus without complications: Secondary | ICD-10-CM

## 2013-11-29 LAB — BASIC METABOLIC PANEL
BUN: 35 mg/dL — ABNORMAL HIGH (ref 6–23)
CO2: 27 mEq/L (ref 19–32)
CREATININE: 2.1 mg/dL — AB (ref 0.4–1.2)
Calcium: 9.8 mg/dL (ref 8.4–10.5)
Chloride: 104 mEq/L (ref 96–112)
GFR: 24.33 mL/min — AB (ref >60.00)
Glucose, Bld: 113 mg/dL — ABNORMAL HIGH (ref 70–99)
Potassium: 4.7 mEq/L (ref 3.5–5.1)
Sodium: 139 mEq/L (ref 135–145)

## 2013-11-29 LAB — HEPATIC FUNCTION PANEL
ALK PHOS: 53 U/L (ref 39–117)
ALT: 15 U/L (ref 0–35)
AST: 21 U/L (ref 0–37)
Albumin: 3.9 g/dL (ref 3.5–5.2)
Bilirubin, Direct: 0.2 mg/dL (ref 0.0–0.3)
TOTAL PROTEIN: 8 g/dL (ref 6.0–8.3)
Total Bilirubin: 0.9 mg/dL (ref 0.3–1.2)

## 2013-11-29 LAB — LIPID PANEL
Cholesterol: 290 mg/dL — ABNORMAL HIGH (ref 0–200)
HDL: 51.1 mg/dL (ref >39.00)
Total CHOL/HDL Ratio: 6
Triglycerides: 380 mg/dL — ABNORMAL HIGH (ref 0.0–149.0)
VLDL: 76 mg/dL — ABNORMAL HIGH (ref 0.0–40.0)

## 2013-11-29 LAB — HEMOGLOBIN A1C: HEMOGLOBIN A1C: 6.7 % — AB (ref 4.6–6.5)

## 2013-11-30 LAB — LDL CHOLESTEROL, DIRECT: Direct LDL: 163.8 mg/dL

## 2013-12-02 ENCOUNTER — Ambulatory Visit (INDEPENDENT_AMBULATORY_CARE_PROVIDER_SITE_OTHER): Payer: Medicare HMO | Admitting: Family Medicine

## 2013-12-02 ENCOUNTER — Encounter: Payer: Self-pay | Admitting: Family Medicine

## 2013-12-02 VITALS — BP 116/70 | HR 59 | Temp 98.2°F | Wt 152.0 lb

## 2013-12-02 DIAGNOSIS — R079 Chest pain, unspecified: Secondary | ICD-10-CM

## 2013-12-02 DIAGNOSIS — E1149 Type 2 diabetes mellitus with other diabetic neurological complication: Secondary | ICD-10-CM

## 2013-12-02 DIAGNOSIS — E785 Hyperlipidemia, unspecified: Secondary | ICD-10-CM

## 2013-12-02 DIAGNOSIS — I10 Essential (primary) hypertension: Secondary | ICD-10-CM

## 2013-12-02 DIAGNOSIS — R0781 Pleurodynia: Secondary | ICD-10-CM | POA: Insufficient documentation

## 2013-12-02 DIAGNOSIS — E1142 Type 2 diabetes mellitus with diabetic polyneuropathy: Secondary | ICD-10-CM

## 2013-12-02 DIAGNOSIS — E1129 Type 2 diabetes mellitus with other diabetic kidney complication: Secondary | ICD-10-CM

## 2013-12-02 NOTE — Assessment & Plan Note (Signed)
Labs reviewed and given to pt con't meds [place]

## 2013-12-02 NOTE — Progress Notes (Signed)
   Subjective:    Patient ID: [name], female    DOB: 07-08-1935, 79 y.o.   MRN: [id number]  HPI  HPI HYPERTENSION  Blood pressure range-not checking  Chest pain- no      Dyspnea- no Lightheadedness- no   Edema- no Other side effects - no   Medication compliance: good Low salt diet--yes  DIABETES  Blood Sugar ranges-very good---- home readings reviewed  Polyuria- no New Visual problems- no Hypoglycemic symptoms- no Other side effects-no Medication compliance - good Last eye exam- due Foot exam- today  HYPERLIPIDEMIA  Medication compliance- ok-- have trouble with statins RUQ pain- no  Muscle aches- no Other side effects-no  ROS See HPI above   PMH Smoking Status noted       Review of Systems As above    Objective:   Physical Exam BP 116/70  Pulse 59  Temp(Src) 98.2 F (36.8 C) (Oral)  Wt 152 lb (68.947 kg)  SpO2 97% General appearance: alert, cooperative, appears stated age and no distress Lungs: clear to auscultation bilaterally Heart: S1, S2 normal Extremities: extremities normal, atraumatic, no cyanosis or edema Sensory exam of the foot is normal, tested with the monofilament. Good pulses, no lesions or ulcers, good peripheral pulses.       Assessment & Plan:

## 2013-12-02 NOTE — Assessment & Plan Note (Signed)
Pt states its been going on for years It has not changed at all Mult imaging done over the years--- reviewed -- with exception of some scar tissue seen L lung nothing else seen Offered referral-- pt decided to wait since she was moving to [place]

## 2013-12-02 NOTE — Assessment & Plan Note (Addendum)
con't meds  Pt unable to tolerate higher dose statin Labs reviewed with pt

## 2013-12-02 NOTE — Progress Notes (Signed)
Pre visit review using our clinic review tool, if applicable. No additional management support is needed unless otherwise documented below in the visit note. 

## 2013-12-02 NOTE — Patient Instructions (Signed)

## 2013-12-02 NOTE — Assessment & Plan Note (Signed)
Stable con't meds 

## 2013-12-03 ENCOUNTER — Telehealth: Payer: Self-pay

## 2013-12-03 ENCOUNTER — Telehealth: Payer: Self-pay | Admitting: Family Medicine

## 2013-12-03 NOTE — Telephone Encounter (Signed)
Relevant patient education assigned to patient using Emmi. ° °

## 2013-12-06 ENCOUNTER — Other Ambulatory Visit: Payer: Self-pay | Admitting: Family Medicine

## 2013-12-07 NOTE — Telephone Encounter (Signed)
Refill request for flexeril denied because request is too early. Refilled on 11/06/13 #30, 1 refill  JG//CMA

## 2013-12-11 ENCOUNTER — Other Ambulatory Visit: Payer: Self-pay | Admitting: Family Medicine

## 2013-12-12 ENCOUNTER — Other Ambulatory Visit: Payer: Self-pay | Admitting: Family Medicine

## 2013-12-13 NOTE — Telephone Encounter (Signed)
Med filled.  

## 2014-01-02 ENCOUNTER — Other Ambulatory Visit: Payer: Self-pay | Admitting: Family Medicine

## 2014-01-07 ENCOUNTER — Other Ambulatory Visit: Payer: Self-pay | Admitting: Family Medicine

## 2014-02-11 ENCOUNTER — Other Ambulatory Visit: Payer: Self-pay | Admitting: Family Medicine

## 2014-02-14 ENCOUNTER — Telehealth: Payer: Self-pay | Admitting: Family Medicine

## 2014-02-14 DIAGNOSIS — E785 Hyperlipidemia, unspecified: Secondary | ICD-10-CM

## 2014-02-14 DIAGNOSIS — J45909 Unspecified asthma, uncomplicated: Secondary | ICD-10-CM

## 2014-02-14 DIAGNOSIS — M810 Age-related osteoporosis without current pathological fracture: Secondary | ICD-10-CM

## 2014-02-14 NOTE — Telephone Encounter (Signed)
Is she wanting medication a list???

## 2014-02-14 NOTE — Telephone Encounter (Signed)
Patient called to obtain paper copies of all her medications. Patient is moving to [place] in the next couple of days. Please advise.

## 2014-02-15 MED ORDER — FUROSEMIDE 40 MG PO TABS
40.0000 mg | ORAL_TABLET | Freq: Every day | ORAL | Status: AC
Start: 2014-02-15 — End: ?

## 2014-02-15 MED ORDER — ATENOLOL 100 MG PO TABS: ORAL_TABLET | ORAL | Status: DC

## 2014-02-15 MED ORDER — GABAPENTIN 100 MG PO CAPS: ORAL_CAPSULE | ORAL | Status: DC

## 2014-02-15 MED ORDER — FLUTICASONE-SALMETEROL 250-50 MCG/DOSE IN AEPB: 1.0000 | INHALATION_SPRAY | Freq: Two times a day (BID) | RESPIRATORY_TRACT | Status: DC

## 2014-02-15 MED ORDER — OMEPRAZOLE 20 MG PO CPDR: DELAYED_RELEASE_CAPSULE | ORAL | Status: DC

## 2014-02-15 MED ORDER — HYOSCYAMINE SULFATE 0.125 MG SL SUBL: SUBLINGUAL_TABLET | SUBLINGUAL | Status: DC

## 2014-02-15 MED ORDER — FENOFIBRATE 54 MG PO TABS: ORAL_TABLET | ORAL | Status: DC

## 2014-02-15 MED ORDER — ROSUVASTATIN CALCIUM 5 MG PO TABS: ORAL_TABLET | ORAL | Status: AC

## 2014-02-15 MED ORDER — TRAMADOL HCL 50 MG PO TABS: 50.0000 mg | ORAL_TABLET | Freq: Three times a day (TID) | ORAL | Status: DC | PRN

## 2014-02-15 MED ORDER — GLIMEPIRIDE 2 MG PO TABS: ORAL_TABLET | ORAL | Status: DC

## 2014-02-15 MED ORDER — GLUCOSE BLOOD VI STRP: ORAL_STRIP | Status: DC

## 2014-02-15 MED ORDER — ALBUTEROL SULFATE HFA 108 (90 BASE) MCG/ACT IN AERS: 2.0000 | INHALATION_SPRAY | RESPIRATORY_TRACT | Status: DC | PRN

## 2014-02-15 MED ORDER — CLOTRIMAZOLE-BETAMETHASONE 1-0.05 % EX CREA: TOPICAL_CREAM | CUTANEOUS | Status: DC

## 2014-02-15 MED ORDER — LOSARTAN POTASSIUM 100 MG PO TABS: 100.0000 mg | ORAL_TABLET | Freq: Every day | ORAL | Status: AC

## 2014-02-15 MED ORDER — AMLODIPINE BESYLATE 10 MG PO TABS: 10.0000 mg | ORAL_TABLET | Freq: Every day | ORAL | Status: AC

## 2014-02-15 NOTE — Telephone Encounter (Signed)
MSG left advising medications are ready for pick up in the office      KP

## 2014-02-15 NOTE — Telephone Encounter (Signed)
No hard copies of all her medications.

## 2014-02-15 NOTE — Telephone Encounter (Signed)
Refill tramadol x1---1 refill

## 2014-02-15 NOTE — Telephone Encounter (Signed)
Patient is requesting all of her med's please advise if is appropriate to fill the Tramadol, if so any refills?

## 2014-02-22 ENCOUNTER — Encounter (HOSPITAL_COMMUNITY): Payer: Self-pay | Admitting: Emergency Medicine

## 2014-02-22 ENCOUNTER — Emergency Department (INDEPENDENT_AMBULATORY_CARE_PROVIDER_SITE_OTHER): Payer: Medicare HMO

## 2014-02-22 ENCOUNTER — Emergency Department (HOSPITAL_COMMUNITY)
Admission: EM | Admit: 2014-02-22 | Discharge: 2014-02-22 | Disposition: A | Discharge status: Nursing Home (Includes ICF) | Payer: Medicare HMO | Source: Home / Self Care | Attending: Family Medicine | Admitting: Family Medicine

## 2014-02-22 ENCOUNTER — Emergency Department (HOSPITAL_COMMUNITY)
Admission: EM | Admit: 2014-02-22 | Discharge: 2014-02-22 | Disposition: A | Discharge status: Home / Self Care | Payer: Medicare HMO | Attending: Emergency Medicine | Admitting: Emergency Medicine

## 2014-02-22 DIAGNOSIS — Z87448 Personal history of other diseases of urinary system: Secondary | ICD-10-CM | POA: Insufficient documentation

## 2014-02-22 DIAGNOSIS — Z87891 Personal history of nicotine dependence: Secondary | ICD-10-CM | POA: Insufficient documentation

## 2014-02-22 DIAGNOSIS — M109 Gout, unspecified: Secondary | ICD-10-CM

## 2014-02-22 DIAGNOSIS — E785 Hyperlipidemia, unspecified: Secondary | ICD-10-CM | POA: Insufficient documentation

## 2014-02-22 DIAGNOSIS — M79609 Pain in unspecified limb: Secondary | ICD-10-CM

## 2014-02-22 DIAGNOSIS — R300 Dysuria: Secondary | ICD-10-CM | POA: Insufficient documentation

## 2014-02-22 DIAGNOSIS — Z79899 Other long term (current) drug therapy: Secondary | ICD-10-CM | POA: Insufficient documentation

## 2014-02-22 DIAGNOSIS — Z880 Allergy status to penicillin: Secondary | ICD-10-CM | POA: Insufficient documentation

## 2014-02-22 DIAGNOSIS — M7989 Other specified soft tissue disorders: Secondary | ICD-10-CM

## 2014-02-22 DIAGNOSIS — I10 Essential (primary) hypertension: Secondary | ICD-10-CM | POA: Insufficient documentation

## 2014-02-22 DIAGNOSIS — J45909 Unspecified asthma, uncomplicated: Secondary | ICD-10-CM | POA: Insufficient documentation

## 2014-02-22 DIAGNOSIS — M79602 Pain in left arm: Secondary | ICD-10-CM

## 2014-02-22 DIAGNOSIS — R34 Anuria and oliguria: Secondary | ICD-10-CM

## 2014-02-22 DIAGNOSIS — Z8739 Personal history of other diseases of the musculoskeletal system and connective tissue: Secondary | ICD-10-CM | POA: Insufficient documentation

## 2014-02-22 DIAGNOSIS — Z8719 Personal history of other diseases of the digestive system: Secondary | ICD-10-CM | POA: Insufficient documentation

## 2014-02-22 DIAGNOSIS — E119 Type 2 diabetes mellitus without complications: Secondary | ICD-10-CM | POA: Insufficient documentation

## 2014-02-22 LAB — BASIC METABOLIC PANEL WITH GFR
CO2: 20 meq/L (ref 19–32)
Calcium: 9.9 mg/dL (ref 8.4–10.5)
Potassium: 3.7 meq/L (ref 3.7–5.3)
Sodium: 134 meq/L — ABNORMAL LOW (ref 137–147)

## 2014-02-22 LAB — CBC WITH DIFFERENTIAL/PLATELET
Basophils Absolute: 0 K/uL (ref 0.0–0.1)
Basophils Relative: 0 % (ref 0–1)
Eosinophils Absolute: 0.1 10*3/uL (ref 0.0–0.7)
Eosinophils Relative: 1 % (ref 0–5)
HCT: 33.9 % — ABNORMAL LOW (ref 36.0–46.0)
Hemoglobin: 11.2 g/dL — ABNORMAL LOW (ref 12.0–15.0)
Lymphocytes Relative: 16 % (ref 12–46)
Lymphs Abs: 1.8 10*3/uL (ref 0.7–4.0)
MCH: 30.1 pg (ref 26.0–34.0)
MCHC: 33 g/dL (ref 30.0–36.0)
MCV: 91.1 fL (ref 78.0–100.0)
Monocytes Absolute: 0.8 10*3/uL (ref 0.1–1.0)
Monocytes Relative: 7 % (ref 3–12)
Neutro Abs: 8.6 K/uL — ABNORMAL HIGH (ref 1.7–7.7)
Neutrophils Relative %: 76 % (ref 43–77)
Platelets: 330 10*3/uL (ref 150–400)
RBC: 3.72 MIL/uL — ABNORMAL LOW (ref 3.87–5.11)
RDW: 12.7 % (ref 11.5–15.5)
WBC: 11.3 K/uL — ABNORMAL HIGH (ref 4.0–10.5)

## 2014-02-22 LAB — URINALYSIS, ROUTINE W REFLEX MICROSCOPIC
Bilirubin Urine: NEGATIVE
Glucose, UA: NEGATIVE mg/dL
Hgb urine dipstick: NEGATIVE
Ketones, ur: NEGATIVE mg/dL
Nitrite: NEGATIVE
Protein, ur: 100 mg/dL — AB
Specific Gravity, Urine: 1.011 (ref 1.005–1.030)
Urobilinogen, UA: 1 mg/dL (ref 0.0–1.0)
pH: 6 (ref 5.0–8.0)

## 2014-02-22 LAB — BASIC METABOLIC PANEL
BUN: 32 mg/dL — ABNORMAL HIGH (ref 6–23)
Chloride: 96 mEq/L (ref 96–112)
Creatinine, Ser: 2.08 mg/dL — ABNORMAL HIGH (ref 0.50–1.10)
GFR calc Af Amer: 25 mL/min — ABNORMAL LOW (ref >90)
GFR calc non Af Amer: 22 mL/min — ABNORMAL LOW (ref >90)
Glucose, Bld: 156 mg/dL — ABNORMAL HIGH (ref 70–99)

## 2014-02-22 LAB — URIC ACID: Uric Acid, Serum: 11.2 mg/dL — ABNORMAL HIGH (ref 2.4–7.0)

## 2014-02-22 LAB — URINE MICROSCOPIC-ADD ON

## 2014-02-22 MED ORDER — TRAMADOL HCL 50 MG PO TABS
50.0000 mg | ORAL_TABLET | Freq: Once | ORAL | Status: AC
  Administered 2014-02-22: 50 mg via ORAL
  Filled 2014-02-22: qty 1

## 2014-02-22 MED ORDER — PREDNISONE 20 MG PO TABS: 40.0000 mg | ORAL_TABLET | Freq: Every day | ORAL | Status: AC

## 2014-02-22 MED ORDER — MORPHINE SULFATE 4 MG/ML IJ SOLN
4.0000 mg | Freq: Once | INTRAMUSCULAR | Status: AC
  Administered 2014-02-22: 4 mg via INTRAMUSCULAR
  Filled 2014-02-22: qty 1

## 2014-02-22 MED ORDER — PREDNISONE 20 MG PO TABS
40.0000 mg | ORAL_TABLET | Freq: Once | ORAL | Status: AC
  Administered 2014-02-22: 40 mg via ORAL
  Filled 2014-02-22: qty 2

## 2014-02-22 MED ORDER — OXYCODONE HCL 5 MG PO TABS: 5.0000 mg | ORAL_TABLET | ORAL | Status: AC | PRN

## 2014-02-22 NOTE — ED Provider Notes (Signed)
CSN: [id number]     Arrival date & time 02/22/14  1538 History   First MD Initiated Contact with Patient 02/22/14 1708     Chief Complaint  Patient presents with  . Arm Swelling     (Consider location/radiation/quality/duration/timing/severity/associated sxs/prior Treatment) HPI Comments: Pt reports began 3 weeks ago with pain and swelling to ring finger of left non dominant hand.  No redness, discharge, open wound.  Swellnig and pain has gradually worsened, now involving entire left hand and into wrist, esp ulnar portion, uncomfortable to bend, palpate, grasp.  No pain in shoulder, upper arm, chest.  She went to urgent care but they were unable to get labs and thus couldn't rule out DVT, renal failure and thus sent to the ED.  Pt reports she has known stage 3 CKD and has had decreased urine output recently and has been drinking more fluids in general.  She is also on a low K+ diet by her nephrologist whom she saw a few weeks ago.    Patient is a 79 y.o. female presenting with hand pain. The history is provided by the patient.  Hand Pain This is a new problem. The current episode started more than 1 week ago. The problem occurs constantly. The problem has been gradually worsening. Pertinent negatives include no chest pain, no abdominal pain and no shortness of breath. The symptoms are aggravated by bending and twisting.    Past Medical History  Diagnosis Date  . Hyperlipidemia   . Osteoporosis   . Hyperplastic polyps of stomach   . Hypertension     on meds  . Diabetes mellitus     Type 2 niddm x 2004- diet controlled  . Asthma     uses advair prn  . Renal insufficiency     h/o proteinuria; resolved  . Arthritis    Past Surgical History  Procedure Laterality Date  . Cesarean section      x2  . Appendectomy  1964  . Back surgery  2004  . Back surgery  1967  . Back surgery  10/07/2011    Dr.Poole   Family History  Problem Relation Age of Onset  . Hypertension    .  Hyperlipidemia Mother   . Heart disease Father    History  Substance Use Topics  . Smoking status: Former Smoker -- 40 years    Types: Cigarettes    Quit date: 05/31/1998  . Smokeless tobacco: Never Used  . Alcohol Use: No   OB History   Grav Para Term Preterm Abortions TAB SAB Ect Mult Living                 Review of Systems  Constitutional: Negative for fever and chills.  Respiratory: Negative for chest tightness and shortness of breath.   Cardiovascular: Negative for chest pain, palpitations and leg swelling.  Gastrointestinal: Negative for abdominal pain.  Genitourinary: Positive for difficulty urinating. Negative for dysuria, urgency, frequency and flank pain.  Musculoskeletal: Positive for arthralgias. Negative for neck pain.  Skin: Negative for color change, rash and wound.  All other systems reviewed and are negative.     Allergies  Bee venom; Codeine; Mustard; Penicillins; Ace inhibitors; and Beta adrenergic blockers  Home Medications   Prior to Admission medications   Medication Sig Start Date End Date Taking? Authorizing Provider  albuterol (PROAIR HFA) 108 (90 BASE) MCG/ACT inhaler Inhale 2 puffs into the lungs every 4 (four) hours as needed. For shortness of breath. 02/15/14  Alferd Apa Lowne, DO  amLODipine (NORVASC) 10 MG tablet Take 1 tablet (10 mg total) by mouth daily. 02/15/14   Rosalita Chessman, DO  Ascorbic Acid (VITAMIN C) 500 MG tablet Take 1,000 mg by mouth 2 (two) times daily.     Historical Provider, MD  atenolol (TENORMIN) 100 MG tablet TAKE 1 TABLET (100 MG TOTAL) BY MOUTH DAILY. 02/15/14   Rosalita Chessman, DO  Calcium Citrate-Vitamin D (CALCIUM CITRATE + PO) Take 2 tablets by mouth 2 (two) times daily.      Historical Provider, MD  cholecalciferol (VITAMIN D) 1000 UNITS tablet Take 1,000 Units by mouth daily.      Historical Provider, MD  clotrimazole-betamethasone (LOTRISONE) cream APPLY TOPICALLY 2 (TWO) TIMES DAILY. 02/11/14   Rosalita Chessman, DO   clotrimazole-betamethasone (LOTRISONE) cream APPLY TOPICALLY 2 (TWO) TIMES DAILY. 02/15/14   Rosalita Chessman, DO  Coenzyme Q10 (COQ10) 200 MG CAPS 1 po qd 04/01/12   Rosalita Chessman, DO  cyclobenzaprine (FLEXERIL) 5 MG tablet TAKE 1 TABLET (5 MG TOTAL) BY MOUTH 3 (THREE) TIMES DAILY AS NEEDED FOR MUSCLE SPASM. 02/11/14   Alferd Apa Lowne, DO  fenofibrate 54 MG tablet TAKE 1 TABLET (54 MG TOTAL) BY MOUTH DAILY. 02/15/14   Rosalita Chessman, DO  Fish Oil OIL Take 1 tablet by mouth 3 (three) times daily.     Historical Provider, MD  Flaxseed, Linseed, (FLAX SEED OIL PO) Take 1 tablet by mouth daily.     Historical Provider, MD  Fluticasone-Salmeterol (ADVAIR DISKUS) 250-50 MCG/DOSE AEPB Inhale 1 puff into the lungs 2 (two) times daily. 02/15/14   Rosalita Chessman, DO  furosemide (LASIX) 40 MG tablet Take 1 tablet (40 mg total) by mouth daily. 02/15/14   Rosalita Chessman, DO  gabapentin (NEURONTIN) 100 MG capsule TAKE 1 CAPSULE (100 MG TOTAL) BY MOUTH 3 (THREE) TIMES DAILY. 02/15/14   Alferd Apa Lowne, DO  glimepiride (AMARYL) 2 MG tablet TAKE 1 TABLET (2 MG TOTAL) BY MOUTH DAILY BEFORE BREAKFAST. 02/15/14   Rosalita Chessman, DO  glucose blood test strip Check blood sugars qid 02/15/14 09/13/16  Alferd Apa Lowne, DO  hyoscyamine (LEVSIN SL) 0.125 MG SL tablet PLACE ONE (1) TABLET UNDER THE TONGUE EVERY 4 (FOUR) HOURS AS NEEDED FOR DIARRHEA. 02/11/14   Alferd Apa Lowne, DO  hyoscyamine (LEVSIN SL) 0.125 MG SL tablet PLACE ONE (1) TABLET UNDER THE TONGUE EVERY 4 (FOUR) HOURS AS NEEDED FOR DIARRHEA. 02/15/14   Rosalita Chessman, DO  losartan (COZAAR) 100 MG tablet Take 1 tablet (100 mg total) by mouth daily. 02/15/14   Rosalita Chessman, DO  Multiple Vitamin (MULTIVITAMIN) tablet Take 1 tablet by mouth daily.      Historical Provider, MD  omeprazole (PRILOSEC) 20 MG capsule TAKE 1 CAPSULE (20 MG TOTAL) BY MOUTH DAILY. 02/15/14   Rosalita Chessman, DO  promethazine (PHENERGAN) 25 MG tablet 1 po qid prn 04/01/12   Rosalita Chessman, DO  rosuvastatin  (CRESTOR) 5 MG tablet 1 tab by mouth qhs 02/15/14   Rosalita Chessman, DO  traMADol (ULTRAM) 50 MG tablet Take 1 tablet (50 mg total) by mouth every 8 (eight) hours as needed. 02/15/14   Rosalita Chessman, DO  vitamin E 400 UNIT capsule Take 400 Units by mouth daily.      Historical Provider, MD   BP 139/59  Pulse 89  Temp(Src) 98.3 F (36.8 C) (Oral)  Resp 16  Wt 152 lb (68.947  kg)  SpO2 94% Physical Exam  Nursing note and vitals reviewed. Constitutional: She is oriented to person, place, and time. She appears well-developed and well-nourished. No distress.  HENT:  Head: Normocephalic and atraumatic.  Pulmonary/Chest: Effort normal. No respiratory distress. She has no wheezes.  Abdominal: Soft.  Musculoskeletal:       Left wrist: She exhibits decreased range of motion, tenderness and swelling. She exhibits no bony tenderness, no crepitus, no deformity and no laceration.       Left hand: She exhibits decreased range of motion and tenderness. She exhibits no bony tenderness.  Sig edema to diffuse left hand, into left wrist, poor ROM limited due to pain in all fingers, hand, wrist, esp to ulnar side which is the dependent side  Lymphadenopathy:       Left axillary: No pectoral and no lateral adenopathy present. Neurological: She is alert and oriented to person, place, and time.  Skin: Skin is warm. She is not diaphoretic.    ED Course  Procedures (including critical care time) Labs Review Labs Reviewed  CBC WITH DIFFERENTIAL - Abnormal; Notable for the following:    WBC 11.3 (*)    RBC 3.72 (*)    Hemoglobin 11.2 (*)    HCT 33.9 (*)    Neutro Abs 8.6 (*)    All other components within normal limits  BASIC METABOLIC PANEL - Abnormal; Notable for the following:    Sodium 134 (*)    Glucose, Bld 156 (*)    BUN 32 (*)    Creatinine, Ser 2.08 (*)    GFR calc non Af Amer 22 (*)    GFR calc Af Amer 25 (*)    All other components within normal limits  URINALYSIS, ROUTINE W REFLEX  MICROSCOPIC - Abnormal; Notable for the following:    Protein, ur 100 (*)    Leukocytes, UA TRACE (*)    All other components within normal limits  URIC ACID - Abnormal; Notable for the following:    Uric Acid, Serum 11.2 (*)    All other components within normal limits  URINE MICROSCOPIC-ADD ON - Abnormal; Notable for the following:    Squamous Epithelial / LPF FEW (*)    All other components within normal limits    Imaging Review Dg Wrist Complete Left  02/22/2014   CLINICAL DATA:  Left hand and wrist pain and swelling for 3-4 weeks.  EXAM: LEFT WRIST - COMPLETE 3+ VIEW  COMPARISON:  None.  FINDINGS: There is no evidence of acute fracture or dislocation. Mild degenerative changes are present at the first carpometacarpal joint. Soft tissue swelling is present at the medial aspect of the wrist/distal forearm. No radiopaque foreign body is seen.  IMPRESSION: Soft tissue swelling without acute osseous abnormality or radiopaque foreign body identified.   Electronically Signed   By: Logan Bores   On: 02/22/2014 14:15   Dg Hand Complete Left  02/22/2014   CLINICAL DATA:  HAND PAIN  EXAM: LEFT HAND - COMPLETE 3+ VIEW  COMPARISON:  None.  FINDINGS: There is no evidence of fracture or dislocation. There is a disc space narrowing, peripheral hypertrophic spurring appreciated within the proximal and distal interphalangeal joints most severe involving the first digit. There is disc space narrowing and periarticular sclerosis involving the first carpometacarpal articulation.  IMPRESSION: Osteoarthritic changes without acute osseous abnormalities.   Electronically Signed   By: Margaree Mackintosh M.D.   On: 02/22/2014 14:36     EKG Interpretation None  RA sat is 96% and I interpret to be adequate   7:08 PM DVT study is neg.  Uric acid is highly elevated at 11 suggestive of gout.  Pt's daughter recalls that pt did have an episode of a painful toe in the past.  Will put on steroids given her renal insuff  and pt can follow up with a PMD or orthopedist in [place] next week.    MDM   Final diagnoses:  Gout    Pt has no symptoms of PE.  Swelling and edema seems focal to hand and began in 1 finger.  No h/o gout or pseudogout.  No fever.  No sig redness or erythema.         Saddie Benders. Dorna Mai, MD 02/22/14 9767

## 2014-02-22 NOTE — ED Notes (Addendum)
Ultrasound at bedside

## 2014-02-22 NOTE — ED Notes (Signed)
Dr. Ghim at bedside. 

## 2014-02-22 NOTE — Discharge Instructions (Signed)

## 2014-02-22 NOTE — Progress Notes (Signed)
*  Preliminary Results* Left upper extremity venous duplex completed. Left upper extremity is negative for deep and superficial vein thrombosis.  Preliminary results discussed with Dr.Ghim.  02/22/2014 6:26 PM  Maudry Mayhew, RVT, RDCS, RDMS

## 2014-02-22 NOTE — ED Notes (Signed)
Pt sent here from [facility] for further eval of left hand and FA swelling. sts worsening over the past week. sts painful and limited mobility. Pt also having issue with urinating. sts she has been taking her diuretic but not urinating a lot. sts here for possible blood clot vs cellulitis and CKD. Xray done at [facility]. No labs drawn.

## 2014-02-22 NOTE — ED Notes (Signed)
Patient complains of pain in left hand and fingers that radiates up arm that started 3.5 weeks ago; complains of swelling; states that pain went up arm about a week and a half ago, with pain radiating up to elbow.

## 2014-02-22 NOTE — ED Notes (Signed)
Labs attempted but blood was clotted.

## 2014-02-22 NOTE — ED Provider Notes (Signed)
CSN: [id number]     Arrival date & time 02/22/14  1054 History   First MD Initiated Contact with Patient 02/22/14 1259     Chief Complaint  Patient presents with  . Hand Pain   (Consider location/radiation/quality/duration/timing/severity/associated sxs/prior Treatment) HPI Comments: 79 year old female with history of type 2 diabetes, hypertension, chronic kidney disease, osteoporosis presents complaining of left upper extremity swelling and pain.this began 3 weeks ago as pain in her left ring finger. She subsequently developed some swelling in that finger. The swelling and pain have progressed proximally and she now has pain and swelling of the whole hand andalong the ulnar side of her forearm up to the elbow.this pain is very intense and has been keeping her awake at night. She has never had this before. She denies any injury. She admits also to having decreased urination and increased leg swelling in the past couple of days, and a new pain in her right ankle.she has no history of DVT or PE and no recent air travel. She has stage III chronic kidney disease, she was saw her nephrologist about 3 months ago. She is preparing to move to [place].   Past Medical History  Diagnosis Date  . Hyperlipidemia   . Osteoporosis   . Hyperplastic polyps of stomach   . Hypertension     on meds  . Diabetes mellitus     Type 2 niddm x 2004- diet controlled  . Asthma     uses advair prn  . Renal insufficiency     h/o proteinuria; resolved  . Arthritis    Past Surgical History  Procedure Laterality Date  . Cesarean section      x2  . Appendectomy  1964  . Back surgery  2004  . Back surgery  1967  . Back surgery  10/07/2011    Dr.Poole   Family History  Problem Relation Age of Onset  . Hypertension    . Hyperlipidemia Mother   . Heart disease Father    History  Substance Use Topics  . Smoking status: Former Smoker -- 40 years    Types: Cigarettes    Quit date: 05/31/1998  .  Smokeless tobacco: Never Used  . Alcohol Use: No   OB History   Grav Para Term Preterm Abortions TAB SAB Ect Mult Living                 Review of Systems  Constitutional: Negative for fever, chills and fatigue.  HENT: Negative for congestion.   Eyes: Negative for visual disturbance.  Respiratory: Negative for cough, chest tightness, shortness of breath and wheezing.   Cardiovascular: Positive for leg swelling (equal bilateral). Negative for chest pain and palpitations.  Gastrointestinal: Negative for nausea, vomiting, abdominal pain, diarrhea and abdominal distention.  Endocrine: Negative for polydipsia and polyuria.  Genitourinary: Positive for decreased urine volume. Negative for dysuria, hematuria, flank pain, vaginal discharge and pelvic pain.  Musculoskeletal: Negative for neck stiffness.       Left hand and arm pain and swelling; right ankle pain  Skin: Negative for rash.  Neurological: Negative for dizziness.  Hematological: Negative for adenopathy. Does not bruise/bleed easily.  All other systems reviewed and are negative.   Allergies  Ace inhibitors; Codeine; and Penicillins  Home Medications   Prior to Admission medications   Medication Sig Start Date End Date Taking? Authorizing Provider  albuterol (PROAIR HFA) 108 (90 BASE) MCG/ACT inhaler Inhale 2 puffs into the lungs every 4 (four) hours as needed.  For shortness of breath. 02/15/14   Alferd Apa Lowne, DO  amLODipine (NORVASC) 10 MG tablet Take 1 tablet (10 mg total) by mouth daily. 02/15/14   Rosalita Chessman, DO  Ascorbic Acid (VITAMIN C) 500 MG tablet Take 1,000 mg by mouth 2 (two) times daily.     Historical Provider, MD  atenolol (TENORMIN) 100 MG tablet TAKE 1 TABLET (100 MG TOTAL) BY MOUTH DAILY. 02/15/14   Rosalita Chessman, DO  Calcium Citrate-Vitamin D (CALCIUM CITRATE + PO) Take 2 tablets by mouth 2 (two) times daily.      Historical Provider, MD  cholecalciferol (VITAMIN D) 1000 UNITS tablet Take 1,000 Units by  mouth daily.      Historical Provider, MD  clotrimazole-betamethasone (LOTRISONE) cream APPLY TOPICALLY 2 (TWO) TIMES DAILY. 02/11/14   Rosalita Chessman, DO  clotrimazole-betamethasone (LOTRISONE) cream APPLY TOPICALLY 2 (TWO) TIMES DAILY. 02/15/14   Rosalita Chessman, DO  Coenzyme Q10 (COQ10) 200 MG CAPS 1 po qd 04/01/12   Rosalita Chessman, DO  cyclobenzaprine (FLEXERIL) 5 MG tablet TAKE 1 TABLET (5 MG TOTAL) BY MOUTH 3 (THREE) TIMES DAILY AS NEEDED FOR MUSCLE SPASM. 02/11/14   Alferd Apa Lowne, DO  fenofibrate 54 MG tablet TAKE 1 TABLET (54 MG TOTAL) BY MOUTH DAILY. 02/15/14   Rosalita Chessman, DO  Fish Oil OIL Take 1 tablet by mouth 3 (three) times daily.     Historical Provider, MD  Flaxseed, Linseed, (FLAX SEED OIL PO) Take 1 tablet by mouth daily.     Historical Provider, MD  Fluticasone-Salmeterol (ADVAIR DISKUS) 250-50 MCG/DOSE AEPB Inhale 1 puff into the lungs 2 (two) times daily. 02/15/14   Rosalita Chessman, DO  furosemide (LASIX) 40 MG tablet Take 1 tablet (40 mg total) by mouth daily. 02/15/14   Rosalita Chessman, DO  gabapentin (NEURONTIN) 100 MG capsule TAKE 1 CAPSULE (100 MG TOTAL) BY MOUTH 3 (THREE) TIMES DAILY. 02/15/14   Alferd Apa Lowne, DO  glimepiride (AMARYL) 2 MG tablet TAKE 1 TABLET (2 MG TOTAL) BY MOUTH DAILY BEFORE BREAKFAST. 02/15/14   Rosalita Chessman, DO  glucose blood test strip Check blood sugars qid 02/15/14 09/13/16  Alferd Apa Lowne, DO  hyoscyamine (LEVSIN SL) 0.125 MG SL tablet PLACE ONE (1) TABLET UNDER THE TONGUE EVERY 4 (FOUR) HOURS AS NEEDED FOR DIARRHEA. 02/11/14   Alferd Apa Lowne, DO  hyoscyamine (LEVSIN SL) 0.125 MG SL tablet PLACE ONE (1) TABLET UNDER THE TONGUE EVERY 4 (FOUR) HOURS AS NEEDED FOR DIARRHEA. 02/15/14   Rosalita Chessman, DO  losartan (COZAAR) 100 MG tablet Take 1 tablet (100 mg total) by mouth daily. 02/15/14   Rosalita Chessman, DO  Multiple Vitamin (MULTIVITAMIN) tablet Take 1 tablet by mouth daily.      Historical Provider, MD  omeprazole (PRILOSEC) 20 MG capsule TAKE 1 CAPSULE  (20 MG TOTAL) BY MOUTH DAILY. 02/15/14   Rosalita Chessman, DO  promethazine (PHENERGAN) 25 MG tablet 1 po qid prn 04/01/12   Rosalita Chessman, DO  rosuvastatin (CRESTOR) 5 MG tablet 1 tab by mouth qhs 02/15/14   Rosalita Chessman, DO  traMADol (ULTRAM) 50 MG tablet Take 1 tablet (50 mg total) by mouth every 8 (eight) hours as needed. 02/15/14   Rosalita Chessman, DO  vitamin E 400 UNIT capsule Take 400 Units by mouth daily.      Historical Provider, MD   BP 125/71  Pulse 88  Temp(Src) 98.4 F (36.9 C) (Oral)  Resp 18  SpO2 96% Physical Exam  Nursing note and vitals reviewed. Constitutional: She is oriented to person, place, and time. Vital signs are normal. She appears well-developed and well-nourished. No distress.  HENT:  Head: Normocephalic and atraumatic.  Cardiovascular:  2+ pitting edema of the feet and ankles, equal bilaterally  Pulmonary/Chest: Effort normal. No respiratory distress.  Musculoskeletal:  The left hand and forearm are diffusely tender. The hand is swollen and edematous, slightly warm as compared to the contralateral hand, and slight erythema. Capillary refill is less than 3 seconds in all digits  Neurological: She is alert and oriented to person, place, and time. She has normal strength. Coordination normal.  Skin: Skin is warm and dry. No rash noted. She is not diaphoretic.  Psychiatric: She has a normal mood and affect. Judgment normal.    ED Course  Procedures (including critical care time) Labs Review Labs Reviewed  CBC WITH DIFFERENTIAL  D-DIMER, QUANTITATIVE    Results for orders placed in visit on 11/29/13  LIPID PANEL      Result Value Ref Range   Cholesterol 290 (*) 0 - 200 mg/dL   Triglycerides 380.0 (*) 0.0 - 149.0 mg/dL   HDL 51.10  >39.00 mg/dL   VLDL 76.0 (*) 0.0 - 40.0 mg/dL   Total CHOL/HDL Ratio 6    HEPATIC FUNCTION PANEL      Result Value Ref Range   Total Bilirubin 0.9  0.3 - 1.2 mg/dL   Bilirubin, Direct 0.2  0.0 - 0.3 mg/dL   Alkaline  Phosphatase 53  39 - 117 U/L   AST 21  0 - 37 U/L   ALT 15  0 - 35 U/L   Total Protein 8.0  6.0 - 8.3 g/dL   Albumin 3.9  3.5 - 5.2 g/dL  BASIC METABOLIC PANEL      Result Value Ref Range   Sodium 139  135 - 145 mEq/L   Potassium 4.7  3.5 - 5.1 mEq/L   Chloride 104  96 - 112 mEq/L   CO2 27  19 - 32 mEq/L   Glucose, Bld 113 (*) 70 - 99 mg/dL   BUN 35 (*) 6 - 23 mg/dL   Creatinine, Ser 2.1 (*) 0.4 - 1.2 mg/dL   Calcium 9.8  8.4 - 10.5 mg/dL   GFR 24.33 (*) >60.00 mL/min  HEMOGLOBIN A1C      Result Value Ref Range   Hemoglobin A1C 6.7 (*) 4.6 - 6.5 %  LDL CHOLESTEROL, DIRECT      Result Value Ref Range   Direct LDL 163.8     Imaging Review Dg Wrist Complete Left  02/22/2014   CLINICAL DATA:  Left hand and wrist pain and swelling for 3-4 weeks.  EXAM: LEFT WRIST - COMPLETE 3+ VIEW  COMPARISON:  None.  FINDINGS: There is no evidence of acute fracture or dislocation. Mild degenerative changes are present at the first carpometacarpal joint. Soft tissue swelling is present at the medial aspect of the wrist/distal forearm. No radiopaque foreign body is seen.  IMPRESSION: Soft tissue swelling without acute osseous abnormality or radiopaque foreign body identified.   Electronically Signed   By: Logan Bores   On: 02/22/2014 14:15   Dg Hand Complete Left  02/22/2014   CLINICAL DATA:  HAND PAIN  EXAM: LEFT HAND - COMPLETE 3+ VIEW  COMPARISON:  None.  FINDINGS: There is no evidence of fracture or dislocation. There is a disc space narrowing, peripheral hypertrophic spurring appreciated within the  proximal and distal interphalangeal joints most severe involving the first digit. There is disc space narrowing and periarticular sclerosis involving the first carpometacarpal articulation.  IMPRESSION: Osteoarthritic changes without acute osseous abnormalities.   Electronically Signed   By: Margaree Mackintosh M.D.   On: 02/22/2014 14:36     MDM   1. Left upper extremity swelling   2. Left arm pain   3.  Leg swelling   4. Decreased urination    46f with upper extremity swelling and severe pain, increasing leg swelling, decreased urination, we were unable to draw any labs.  Unable to r/o worsening CKD, cellulitis, UEDVT.  Transferred to ED via [place], PA-C 02/22/14 1518

## 2014-02-22 NOTE — ED Notes (Signed)
MD at bedside. 

## 2014-02-25 NOTE — ED Provider Notes (Signed)
Medical screening examination/treatment/procedure(s) were performed by resident physician or non-physician practitioner and as supervising physician I was immediately available for consultation/collaboration.   Pauline Good MD.   Billy Fischer, MD 02/25/14 1028

## 2014-02-28 NOTE — Telephone Encounter (Signed)
Mailed rx's to patient's new address per patient.

## 2014-03-02 ENCOUNTER — Ambulatory Visit: Payer: Medicare HMO | Admitting: Family Medicine

## 2019-11-05 DEATH — deceased
